# Patient Record
Sex: Female | Born: 1990 | Race: Black or African American | Hispanic: No | Marital: Married | Smoking: Never smoker
Health system: Southern US, Community
[De-identification: ages and names within clinical notes are randomized; demographics above are authoritative.]

## PROBLEM LIST (undated history)

## (undated) DIAGNOSIS — J302 Other seasonal allergic rhinitis: Secondary | ICD-10-CM

## (undated) DIAGNOSIS — N921 Excessive and frequent menstruation with irregular cycle: Secondary | ICD-10-CM

## (undated) DIAGNOSIS — R87619 Unspecified abnormal cytological findings in specimens from cervix uteri: Secondary | ICD-10-CM

## (undated) DIAGNOSIS — D259 Leiomyoma of uterus, unspecified: Secondary | ICD-10-CM

## (undated) DIAGNOSIS — N946 Dysmenorrhea, unspecified: Secondary | ICD-10-CM

## (undated) HISTORY — PX: LEEP: SHX91

## (undated) HISTORY — DX: Unspecified abnormal cytological findings in specimens from cervix uteri: R87.619

## (undated) HISTORY — DX: Excessive and frequent menstruation with irregular cycle: N92.1

## (undated) HISTORY — DX: Leiomyoma of uterus, unspecified: D25.9

## (undated) HISTORY — DX: Dysmenorrhea, unspecified: N94.6

---

## 2009-03-11 DIAGNOSIS — R87619 Unspecified abnormal cytological findings in specimens from cervix uteri: Secondary | ICD-10-CM

## 2009-03-11 HISTORY — DX: Unspecified abnormal cytological findings in specimens from cervix uteri: R87.619

## 2016-10-29 DIAGNOSIS — Z0000 Encounter for general adult medical examination without abnormal findings: Secondary | ICD-10-CM | POA: Diagnosis not present

## 2016-10-29 DIAGNOSIS — N920 Excessive and frequent menstruation with regular cycle: Secondary | ICD-10-CM | POA: Diagnosis not present

## 2016-10-29 DIAGNOSIS — Z713 Dietary counseling and surveillance: Secondary | ICD-10-CM | POA: Diagnosis not present

## 2016-11-05 MED FILL — NORG-ETHIN ESTRA 0.25-0.035: 0.25-35 | 84 days supply | Qty: 84 | Fill #0

## 2016-11-05 MED FILL — IBUPROFEN 800 MG TAB: 800 | 10 days supply | Qty: 30 | Fill #0

## 2016-12-23 ENCOUNTER — Ambulatory Visit (INDEPENDENT_AMBULATORY_CARE_PROVIDER_SITE_OTHER): Payer: 59 | Admitting: Physician Assistant

## 2016-12-23 ENCOUNTER — Encounter: Payer: Self-pay | Admitting: Physician Assistant

## 2016-12-23 VITALS — BP 119/81 | HR 86 | Ht 66.0 in | Wt 207.0 lb

## 2016-12-23 DIAGNOSIS — Z7689 Persons encountering health services in other specified circumstances: Secondary | ICD-10-CM | POA: Diagnosis not present

## 2016-12-23 DIAGNOSIS — Z6833 Body mass index (BMI) 33.0-33.9, adult: Secondary | ICD-10-CM | POA: Diagnosis not present

## 2016-12-23 DIAGNOSIS — R635 Abnormal weight gain: Secondary | ICD-10-CM | POA: Insufficient documentation

## 2016-12-23 DIAGNOSIS — R5383 Other fatigue: Secondary | ICD-10-CM | POA: Insufficient documentation

## 2016-12-23 DIAGNOSIS — E6609 Other obesity due to excess calories: Secondary | ICD-10-CM

## 2016-12-23 MED ORDER — PHENTERMINE HCL 37.5 MG PO TABS: ORAL_TABLET | ORAL | 0 refills | Status: DC

## 2016-12-23 MED FILL — PHENTERMINE 37.5 MG TABLET: 37.5 | 30 days supply | Qty: 30 | Fill #0

## 2016-12-23 NOTE — Progress Notes (Signed)
HPI:                                                                Penny Klein is a 26 y.o. female who presents to [facility] today to establish care  Current concerns: weight gain  Patient reports weight fluctuates between 208-215. Last healthy weight: 2016, 180 Lb Goal weight: 170-180 Lb Diet goal: high protein, low fat, measuring cups Exercise goal: walking in her neighborhood, 3-4 days per week Patient also reports lack of energy and worsening fatigue for the past 2 years. Reports adequate sleep, 7 hours, no nighttime awakenings.  Past Medical History:  Diagnosis Date  . Dysmenorrhea   . Leiomyoma of uterus   . Menometrorrhagia    No past surgical history on file. Social History  Substance Use Topics  . Smoking status: Not on file  . Smokeless tobacco: Not on file  . Alcohol use Not on file   family history is not on file.  ROS: Review of Systems  Constitutional: Positive for malaise/fatigue (fatigue x 2 years).  Respiratory: Negative.   Cardiovascular: Negative.   Gastrointestinal: Negative.   Genitourinary: Negative.        + dysmenorrhea  Musculoskeletal: Negative.   Skin: Negative.   Neurological: Negative.      Medications: Current Outpatient Prescriptions  Medication Sig Dispense Refill  . ibuprofen (ADVIL,MOTRIN) 800 MG tablet Take 800 mg by mouth.     No current facility-administered medications for this visit.    No Known Allergies     Objective:  BP 119/81   Pulse 86   Ht 5\' 6"  (1.676 m)   Wt 207 lb (93.9 kg)   LMP 12/06/2016   BMI 33.41 kg/m  Gen:  alert, not ill-appearing, no distress, appropriate for age 31: head normocephalic without obvious abnormality, conjunctiva and cornea clear, trachea midline Pulm: Normal work of breathing, normal phonation, clear to auscultation bilaterally, no wheezes, rales or rhonchi CV: Normal rate, regular rhythm, s1 and s2 distinct, no murmurs,  clicks or rubs  Neuro: alert and oriented x 3, no tremor MSK: extremities atraumatic, normal gait and station Skin: intact, no rashes on exposed skin, no jaundice, no cyanosis Psych: well-groomed, cooperative, good eye contact, euthymic mood, affect mood-congruent, speech is articulate, and thought processes clear and goal-directed  No flowsheet data found.   No results found for this or any previous visit (from the past 72 hour(s)). No results found.    Assessment and Plan: 26 y.o. female with   1. Encounter to establish care - reviewed PMH, PSH, PFH, medications and allergies  2. Lack of energy   3. Other fatigue - CBC - Comprehensive metabolic panel - C-reactive protein - Ferritin - Sedimentation rate - TSH - Vitamin B12 - Vit D  25 hydroxy (rtn osteoporosis monitoring)  4. Abnormal weight gain Wt Readings from Last 3 Encounters:  12/23/16 207 lb (93.9 kg)  Weight loss goal: 27 pounds Exercise goal: walk/jog x 30-45 minutes at least 3 days per week Dietary goal:  Limit calories to 1800 per day to lose 1 pound per week, Limit to 1300 per day to lose 2 pounds/week  Measure caloric intake with app such as MyFitness Pal Follow DASH or Mediterranean eating  plans (heart healthy, low fat, Klein protein) Cut out soda and sweetened beverages Drink at least 1L of water per day Substitute fruit for sweets/processed sugar - phentermine (ADIPEX-P) 37.5 MG tablet; One tab by mouth qAM  Dispense: 30 tablet; Refill: 0  5. Class 1 obesity due to excess calories without serious comorbidity with body mass index (BMI) of 33.0 to 33.9 in adult - weight loss through calorie restriction and regular cardiovascular exercise in addition to the above     Patient education and anticipatory guidance given Patient agrees with treatment plan Follow-up in 4 weeks for weight check or sooner as needed if symptoms worsen or fail to improve  Darlyne Russian PA-C

## 2016-12-23 NOTE — Patient Instructions (Addendum)
Weight loss goal: 27 pounds Exercise goal: walk/jog x 30-45 minutes at least 3 days per week Dietary goal:  Limit calories to 1800 per day to lose 1 pound per week, Limit to 1300 per day to lose 2 pounds/week  Measure caloric intake with app such as MyFitness Pal Follow DASH or Mediterranean eating plans (heart healthy, low fat, lean protein) Cut out soda and sweetened beverages Drink at least 1L of water per day Substitute fruit for sweets/processed sugar

## 2016-12-25 ENCOUNTER — Encounter: Payer: Self-pay | Admitting: Physician Assistant

## 2016-12-31 DIAGNOSIS — H9201 Otalgia, right ear: Secondary | ICD-10-CM | POA: Diagnosis not present

## 2016-12-31 DIAGNOSIS — H938X9 Other specified disorders of ear, unspecified ear: Secondary | ICD-10-CM | POA: Diagnosis not present

## 2017-01-01 ENCOUNTER — Encounter: Payer: Self-pay | Admitting: Physician Assistant

## 2017-01-02 ENCOUNTER — Emergency Department (HOSPITAL_COMMUNITY)
Admission: EM | Admit: 2017-01-02 | Discharge: 2017-01-02 | Disposition: A | Discharge status: Home / Self Care | Payer: 59 | Attending: Emergency Medicine | Admitting: Emergency Medicine

## 2017-01-02 ENCOUNTER — Other Ambulatory Visit: Payer: Self-pay

## 2017-01-02 ENCOUNTER — Emergency Department (HOSPITAL_COMMUNITY): Payer: 59

## 2017-01-02 DIAGNOSIS — H6983 Other specified disorders of Eustachian tube, bilateral: Secondary | ICD-10-CM | POA: Diagnosis not present

## 2017-01-02 DIAGNOSIS — J3089 Other allergic rhinitis: Secondary | ICD-10-CM | POA: Insufficient documentation

## 2017-01-02 DIAGNOSIS — Z79899 Other long term (current) drug therapy: Secondary | ICD-10-CM | POA: Insufficient documentation

## 2017-01-02 DIAGNOSIS — H6993 Unspecified Eustachian tube disorder, bilateral: Secondary | ICD-10-CM | POA: Diagnosis not present

## 2017-01-02 DIAGNOSIS — R072 Precordial pain: Secondary | ICD-10-CM | POA: Diagnosis not present

## 2017-01-02 DIAGNOSIS — R0602 Shortness of breath: Secondary | ICD-10-CM | POA: Insufficient documentation

## 2017-01-02 DIAGNOSIS — R079 Chest pain, unspecified: Secondary | ICD-10-CM | POA: Diagnosis not present

## 2017-01-02 LAB — BASIC METABOLIC PANEL
Anion gap: 11 (ref 5–15)
BUN: 9 mg/dL (ref 6–20)
CHLORIDE: 106 mmol/L (ref 101–111)
CO2: 23 mmol/L (ref 22–32)
CREATININE: 0.75 mg/dL (ref 0.44–1.00)
Calcium: 10 mg/dL (ref 8.9–10.3)
GFR calc non Af Amer: >60 mL/min (ref >60)
GLUCOSE: 85 mg/dL (ref 65–99)
Potassium: 3.7 mmol/L (ref 3.5–5.1)
Sodium: 140 mmol/L (ref 135–145)

## 2017-01-02 LAB — I-STAT TROPONIN, ED: Troponin i, poc: 0 ng/mL (ref 0.00–0.08)

## 2017-01-02 LAB — CBC
HCT: 42.5 % (ref 36.0–46.0)
Hemoglobin: 14.3 g/dL (ref 12.0–15.0)
MCH: 29.2 pg (ref 26.0–34.0)
MCHC: 33.6 g/dL (ref 30.0–36.0)
MCV: 86.9 fL (ref 78.0–100.0)
Platelets: 337 10*3/uL (ref 150–400)
RBC: 4.89 MIL/uL (ref 3.87–5.11)
RDW: 13.1 % (ref 11.5–15.5)
WBC: 6.3 10*3/uL (ref 4.0–10.5)

## 2017-01-02 LAB — D-DIMER, QUANTITATIVE: D-Dimer, Quant: 0.27 ug/mL-FEU (ref 0.00–0.50)

## 2017-01-02 MED ORDER — FLUTICASONE PROPIONATE 50 MCG/ACT NA SUSP: 2.0000 | Freq: Every day | NASAL | 0 refills | Status: DC

## 2017-01-02 MED ORDER — RANITIDINE HCL 150 MG PO CAPS: 150.0000 mg | ORAL_CAPSULE | Freq: Every day | ORAL | 0 refills | Status: DC

## 2017-01-02 MED ORDER — ASPIRIN 81 MG PO CHEW
324.0000 mg | CHEWABLE_TABLET | Freq: Once | ORAL | Status: AC
  Administered 2017-01-02: 324 mg via ORAL
  Filled 2017-01-02: qty 4

## 2017-01-02 NOTE — ED Notes (Signed)
ED Provider at bedside. 

## 2017-01-02 NOTE — ED Triage Notes (Signed)
To ED for eval of chest pressure starting last pm. Feels winded when walking across the room. Denies n/v/diaphoresis. No hx of same. No long trips noted.

## 2017-01-02 NOTE — ED Notes (Signed)
Pt ambulatory to the BR with steady gait.  Pt reports pain in L upper chest is better that it was.  Rates it at 3/10.  Denies any dizziness or SOB

## 2017-01-02 NOTE — ED Provider Notes (Signed)
[facility] EMERGENCY DEPARTMENT Provider Note   CSN: [id number] Arrival date & time: 01/02/17  0831     History   Chief Complaint Chief Complaint  Patient presents with  . Chest Pain    HPI Penny Klein is a 27 y.o. female.  Penny Klein is a 26 y.o. Female who presents to the ED complaining of left-sided chest pain starting yesterday evening.  She reports her pain is throbbing and across the left side of her chest.  She reports associated shortness of breath and last night she fell asleep while she was still having pain.  When she awoke this morning her pain had resolved.  Her pain returned today around 7:30 AM when she went back to work.  She reports again feeling short of breath.  No pain with deep inspiration.  She denies history of DVT, PE or MI.  She has previously been on birth control pills, however she discontinued this about a month ago.  Last menstrual cycle was 12/23/2016.  She denies recent long travel, leg pain or leg swelling.  She is unable to identify alleviating or aggravating symptoms of her chest pain today.  It is not worse with exertion today.  She denies history of smoking or hyperlipidemia.  No personal or close family history of MI, DVT or PE.  No recent long travel.  She denies fevers, coughing, palpitations, leg pain, leg swelling, syncope, lightheadedness, dizziness, neck pain, abdominal pain, nausea, vomiting, or acid reflux symptoms.   The history is provided by the patient and medical records. No language interpreter was used.  Chest Pain   Associated symptoms include shortness of breath. Pertinent negatives include no abdominal pain, no back pain, no cough, no fever, no headaches, no nausea, no palpitations, no vomiting and no weakness.    Past Medical History:  Diagnosis Date  . Abnormal Pap smear of cervix 2011  . Dysmenorrhea   . Leiomyoma of uterus   . Menometrorrhagia     Patient Active Problem List   Diagnosis Date  Noted  . Lack of energy 12/23/2016  . Other fatigue 12/23/2016  . Abnormal weight gain 12/23/2016  . Class 1 obesity due to excess calories without serious comorbidity with body mass index (BMI) of 33.0 to 33.9 in adult 12/23/2016    Past Surgical History:  Procedure Laterality Date  . LEEP      OB History    No data available       Home Medications    Prior to Admission medications   Medication Sig Start Date End Date Taking? Authorizing Provider  ibuprofen (ADVIL,MOTRIN) 800 MG tablet Take 800 mg by mouth every 8 (eight) hours as needed for moderate pain.  11/05/16  Yes [provider]  norgestimate-ethinyl estradiol (ORTHO-CYCLEN,SPRINTEC,PREVIFEM) 0.25-35 MG-MCG tablet Take 1 tablet by mouth daily. 11/05/16  Yes [provider]  phentermine (ADIPEX-P) 37.5 MG tablet One tab by mouth qAM 12/23/16  Yes Trixie Dredge, PA-C  fluticasone [facility]) 50 MCG/ACT nasal spray Place 2 sprays into both nostrils daily. 01/02/17   Waynetta Pean, PA-C  ranitidine (ZANTAC) 150 MG capsule Take 1 capsule (150 mg total) by mouth daily. 01/02/17   Waynetta Pean, PA-C    Family History Family History  Problem Relation Age of Onset  . Lupus Paternal Uncle   . Hypertension Mother   . Hyperlipidemia Mother   . Obesity Mother   . Hypertension Father   . Anxiety disorder Father   . Hyperlipidemia Father   .  Breast cancer Maternal Aunt   . Breast cancer Maternal Grandmother   . Stomach cancer Paternal Grandmother   . Breast cancer Maternal Aunt     Social History Social History  Substance Use Topics  . Smoking status: Never Smoker  . Smokeless tobacco: Never Used  . Alcohol use Yes     Comment: <1 per month     Allergies   Patient has no known allergies.   Review of Systems Review of Systems  Constitutional: Negative for chills and fever.  HENT: Negative for congestion and sore throat.   Eyes: Negative for visual disturbance.  Respiratory:  Positive for shortness of breath. Negative for cough and wheezing.   Cardiovascular: Positive for chest pain. Negative for palpitations and leg swelling.  Gastrointestinal: Negative for abdominal pain, diarrhea, nausea and vomiting.  Genitourinary: Negative for dysuria.  Musculoskeletal: Negative for back pain and neck pain.  Skin: Negative for rash.  Neurological: Negative for syncope, weakness, light-headedness and headaches.     Physical Exam Updated Vital Signs BP 118/74   Pulse 79   Temp 97.8 F (36.6 C) (Oral)   Resp 15   LMP 12/23/2016 (Exact Date)   SpO2 98%   Physical Exam  Constitutional: She appears well-developed and well-nourished. No distress.  Nontoxic appearing.  HENT:  Head: Normocephalic and atraumatic.  Mouth/Throat: Oropharynx is clear and moist.  Mild bilateral clear middle ear effusion without TM erythema or loss of landmarks.  Boggy nasal turbinates noted bilaterally.  Throat is clear.  Eyes: Pupils are equal, round, and reactive to light. Conjunctivae are normal. Right eye exhibits no discharge. Left eye exhibits no discharge.  Neck: Normal range of motion. Neck supple. No JVD present. No tracheal deviation present.  Cardiovascular: Normal rate, regular rhythm, normal heart sounds and intact distal pulses.  Exam reveals no gallop and no friction rub.   No murmur heard. Bilateral radial, posterior tibialis and dorsalis pedis pulses are intact.    Pulmonary/Chest: Effort normal and breath sounds normal. No stridor. No respiratory distress. She has no wheezes. She has no rales. She exhibits tenderness.  Lungs are clear to ascultation bilaterally. Symmetric chest expansion bilaterally. No increased work of breathing. No rales or rhonchi.  Mild left sided anterior chest wall TTP. No overlying skin changes.   Abdominal: Soft. There is no tenderness. There is no guarding.  Musculoskeletal: She exhibits no edema or tenderness.  No LE edema or TTP.     Lymphadenopathy:    She has no cervical adenopathy.  Neurological: She is alert. No sensory deficit. Coordination normal.  Skin: Skin is warm and dry. Capillary refill takes less than 2 seconds. No rash noted. She is not diaphoretic. No erythema. No pallor.  Psychiatric: She has a normal mood and affect. Her behavior is normal.  Nursing note and vitals reviewed.    ED Treatments / Results  Labs (all labs ordered are listed, but only abnormal results are displayed) Labs Reviewed  BASIC METABOLIC PANEL  CBC  D-DIMER, QUANTITATIVE (NOT AT [facility])  I-STAT TROPONIN, ED    EKG  EKG Interpretation  Date/Time:  Thursday January 02 2017 08:35:42 EDT Ventricular Rate:  95 PR Interval:  132 QRS Duration: 80 QT Interval:  358 QTC Calculation: 449 R Axis:   83 Text Interpretation:  Normal sinus rhythm with sinus arrhythmia Normal ECG No old tracing to compare Confirmed by Sherwood Gambler [phone]) on 01/02/2017 9:18:23 AM       Radiology Dg Chest 2 View  Result  Date: 01/02/2017 CLINICAL DATA:  Chest pain. EXAM: CHEST  2 VIEW COMPARISON:  None. FINDINGS: The heart size and mediastinal contours are within normal limits. Both lungs are clear. The visualized skeletal structures are unremarkable. IMPRESSION: No active cardiopulmonary disease. Electronically Signed   By: Dorise Bullion III M.D   On: 01/02/2017 09:43    Procedures Procedures (including critical care time)  Medications Ordered in ED Medications  aspirin chewable tablet 324 mg (324 mg Oral Given 01/02/17 1154)     Initial Impression / Assessment and Plan / ED Course  I have reviewed the triage vital signs and the nursing notes.  Pertinent labs & imaging results that were available during my care of the patient were reviewed by me and considered in my medical decision making (see chart for details).    This is a 26 y.o. Female who presents to the ED complaining of left-sided chest pain starting yesterday evening.  She  reports her pain is throbbing and across the left side of her chest.  She reports associated shortness of breath and last night she fell asleep while she was still having pain.  When she awoke this morning her pain had resolved.  Her pain returned today around 7:30 AM when she went back to work.  She reports again feeling short of breath.  No pain with deep inspiration.  She denies history of DVT, PE or MI.  She has previously been on birth control pills, however she discontinued this about a month ago.  Last menstrual cycle was 12/23/2016.  She denies recent long travel, leg pain or leg swelling.  She is unable to identify alleviating or aggravating symptoms of her chest pain today.  It is not worse with exertion today.  She denies history of smoking or hyperlipidemia.  No personal or close family history of MI, DVT or PE.  No recent long travel.  She denies fevers, coughing, palpitations.  On exam the patient is afebrile nontoxic appearing.  She has no hypoxia, tachypnea or tachycardia on exam.  No lower extremity edema or tenderness.  She does complain of some ear pressure and popping and she has a mild clear middle ear effusion bilaterally.  Eustachian tube dysfunction noted.  Will provide a prescription for Flonase. I have low suspicion for PE in this patient, however patient did describe trouble breathing with her chest pain.  D-dimer was obtained and this was negative.  Troponin is not elevated.  BMP and CBC are within normal limits.  Chest x-ray is unremarkable. EKG shows normal sinus rhythm.  No STEMI. No concern for ACS in this patient.  At reevaluation patient reports she is feeling much better.  She reports that she does not feel short of breath.  She reports only mild pain to the left side of her chest that she reports may be due to reflux.  She would like to try a course of ranitidine for reflux.  I also discussed the possibility of discontinuing the phentermine as this could make her anxious and  cause chest pain as well. She will discuss with her PCP. Return precautions discussed. I advised the patient to follow-up with their primary care provider this week. I advised the patient to return to the emergency department with new or worsening symptoms or new concerns. The patient verbalized understanding and agreement with plan.    Final Clinical Impressions(s) / ED Diagnoses   Final diagnoses:  Precordial pain  Dysfunction of both eustachian tubes  Allergic rhinitis due to  other allergic trigger, unspecified seasonality    New Prescriptions New Prescriptions   FLUTICASONE (FLONASE) 50 MCG/ACT NASAL SPRAY    Place 2 sprays into both nostrils daily.   RANITIDINE (ZANTAC) 150 MG CAPSULE    Take 1 capsule (150 mg total) by mouth daily.     Waynetta Pean, PA-C 01/02/17 1218    Sherwood Gambler, MD 01/02/17 1540

## 2017-01-02 NOTE — ED Notes (Signed)
Family at bedside. 

## 2017-01-02 NOTE — ED Notes (Signed)
Pt taken to xray. Radiology informed of pts room.

## 2017-01-02 NOTE — ED Notes (Signed)
Pt up too restroom no issues pt states she feels fine.

## 2017-01-20 ENCOUNTER — Ambulatory Visit: Payer: 59 | Admitting: Physician Assistant

## 2017-01-20 DIAGNOSIS — Z0189 Encounter for other specified special examinations: Secondary | ICD-10-CM

## 2017-03-11 NOTE — L&D Delivery Note (Signed)
Delivery Note At 6:16 PM a viable female was delivered via Vaginal, Spontaneous (Presentation:Direct OP ).  APGAR:9 ,9 ; weight pending .   Placenta status:ijntact, spontaneous delivery.  Cord: three vessels with the following complications: none.  Clamped x 2 and cut by FOB after 2 min. Cord pH: N/A  Anesthesia:  Epidural plus lidocaine for repair Episiotomy: None Lacerations: 1st degree Suture Repair: 3.0 vicryl Est. Blood Loss (mL):    Mom to postpartum.  Baby to Couplet care / Skin to Skin.  Darlina Rumpf, CNM 10/30/2017, 6:42 PM

## 2017-03-14 ENCOUNTER — Ambulatory Visit (INDEPENDENT_AMBULATORY_CARE_PROVIDER_SITE_OTHER): Payer: 59 | Admitting: Family Medicine

## 2017-03-14 ENCOUNTER — Encounter: Payer: Self-pay | Admitting: Family Medicine

## 2017-03-14 VITALS — BP 122/75 | HR 76 | Wt 201.0 lb

## 2017-03-14 DIAGNOSIS — Z3480 Encounter for supervision of other normal pregnancy, unspecified trimester: Secondary | ICD-10-CM | POA: Diagnosis not present

## 2017-03-14 DIAGNOSIS — O99213 Obesity complicating pregnancy, third trimester: Secondary | ICD-10-CM

## 2017-03-14 DIAGNOSIS — E6609 Other obesity due to excess calories: Secondary | ICD-10-CM | POA: Diagnosis not present

## 2017-03-14 DIAGNOSIS — Z3687 Encounter for antenatal screening for uncertain dates: Secondary | ICD-10-CM

## 2017-03-14 DIAGNOSIS — Z6833 Body mass index (BMI) 33.0-33.9, adult: Secondary | ICD-10-CM

## 2017-03-14 DIAGNOSIS — Z113 Encounter for screening for infections with a predominantly sexual mode of transmission: Secondary | ICD-10-CM | POA: Diagnosis not present

## 2017-03-14 NOTE — Progress Notes (Signed)
Subjective:  Penny Klein is a [id number] [redacted]w[redacted]d being seen today for her first obstetrical visit.  Her obstetrical history is significant for obesity. Patient does intend to breast feed. Pregnancy history fully reviewed.  Patient reports no complaints.  PAP last year - normal. Has history of LEEP after first child. Subsequent PAPs normal.  BP 122/75   Pulse 76   Wt 201 lb (91.2 kg)   LMP 01/23/2017   BMI 32.44 kg/m   HISTORY: OB History  Gravida Para Term Preterm AB Living  3 2 2     2   SAB TAB Ectopic Multiple Live Births          2    # Outcome Date GA Lbr Len/2nd Weight Sex Delivery Anes PTL Lv  3 Current           2 Term 07/30/12 [redacted]w[redacted]d   F Vag-Spont   LIV  1 Term 03/14/09 [redacted]w[redacted]d   M Vag-Spont   LIV      Past Medical History:  Diagnosis Date  . Abnormal Pap smear of cervix 2011  . Dysmenorrhea   . Leiomyoma of uterus   . Menometrorrhagia     Past Surgical History:  Procedure Laterality Date  . LEEP      Family History  Problem Relation Age of Onset  . Lupus Paternal Uncle   . Hypertension Mother   . Hyperlipidemia Mother   . Obesity Mother   . Hypertension Father   . Anxiety disorder Father   . Hyperlipidemia Father   . Breast cancer Maternal Aunt   . Breast cancer Maternal Grandmother   . Stomach cancer Paternal Grandmother   . Breast cancer Maternal Aunt      Exam    Uterus:     Pelvic Exam:    Perineum: No Hemorrhoids, Normal Perineum   Vulva: normal, Bartholin's, Urethra, Skene's normal   Vagina:  normal mucosa   Cervix: multiparous appearance, no cervical motion tenderness and no lesions   Adnexa: normal adnexa and no mass, fullness, tenderness   Bony Pelvis: gynecoid  System: Breast:  normal appearance, no masses or tenderness, Inspection negative, No nipple retraction or dimpling, No nipple discharge or bleeding, No axillary or supraclavicular adenopathy, Normal to palpation without dominant masses   Skin: normal coloration and turgor, no  rashes    Neurologic: gait normal; reflexes normal and symmetric   Extremities: normal strength, tone, and muscle mass, no erythema, induration, or nodules, ROM of all joints is normal   HEENT PERRLA, extra ocular movement intact and sclera clear, anicteric   Mouth/Teeth mucous membranes moist, pharynx normal without lesions   Neck supple and no masses   Cardiovascular: regular rate and rhythm, no murmurs or gallops   Respiratory:  appears well, vitals normal, no respiratory distress, acyanotic, normal RR, ear and throat exam is normal, neck free of mass or lymphadenopathy, chest clear, no wheezing, crepitations, rhonchi, normal symmetric air entry   Abdomen: soft, non-tender; bowel sounds normal; no masses,  no organomegaly   Urinary: urethral meatus normal      Assessment:    Pregnancy: [id number] Patient Active Problem List   Diagnosis Date Noted  . Supervision of other normal pregnancy, antepartum 03/14/2017  . Lack of energy 12/23/2016  . Other fatigue 12/23/2016  . Abnormal weight gain 12/23/2016  . Class 1 obesity due to excess calories without serious comorbidity with body mass index (BMI) of 33.0 to 33.9 in adult 12/23/2016      Plan:  1. Supervision of other normal pregnancy, antepartum Genetic Screening discussed - patient declined testing.  Ultrasound discussed; fetal survey: requested.  Follow up in 4 weeks.  - Obstetric Panel, Including HIV - Culture, OB Urine - VITAMIN D 25 Hydroxy (Vit-D Deficiency, Fractures) - HgB A1c - Urine cytology ancillary only  2. Class 1 obesity due to excess calories without serious comorbidity with body mass index (BMI) of 33.0 to 33.9 in adult - HgB A1c     Problem list reviewed and updated. 50% of 30 min visit spent on counseling and coordination of care.    Truett Mainland 03/14/2017

## 2017-03-14 NOTE — Progress Notes (Signed)
DATING AND VIABILITY SONOGRAM   Penny Klein is a 27 y.o. year old [id number] with LMP Patient's last menstrual period was 01/23/2017. which would correlate to  [redacted]w[redacted]d weeks gestation.  She has regular menstrual cycles.   She is here today for a confirmatory initial sonogram.    GESTATION: singleton   FETAL ACTIVITY:          Heart rate         122           ADNEXA: The ovaries are normal.   GESTATIONAL AGE AND  BIOMETRICS:  Gestational criteria: Estimated Date of Delivery: 10/30/17 by LMP now at [redacted]w[redacted]d  Previous Scans:0      CROWN RUMP LENGTH       0.964mm         7 weeks                                                                               AVERAGE EGA(BY THIS SCAN):  7 weeks  WORKING EDD( LMP ):  10-30-17     TECHNICIAN COMMENTS   A copy of this report including all images has been saved and backed up to a second source for retrieval if needed. All measures and details of the anatomical scan, placentation, fluid volume and pelvic anatomy are contained in that report.  [name] 03/14/2017 9:01 AM

## 2017-03-14 NOTE — Progress Notes (Signed)
Last pap 2017, negative. Pt has had Leep procedure after her first child was born.  Pt states she is having slight HA's and constipation.

## 2017-03-15 LAB — OBSTETRIC PANEL, INCLUDING HIV
Antibody Screen: NEGATIVE
BASOS ABS: 0 10*3/uL (ref 0.0–0.2)
Basos: 0 %
EOS (ABSOLUTE): 0.1 10*3/uL (ref 0.0–0.4)
Eos: 1 %
HEP B S AG: NEGATIVE
HIV Screen 4th Generation wRfx: NONREACTIVE
Hematocrit: 40.8 % (ref 34.0–46.6)
Hemoglobin: 13.5 g/dL (ref 11.1–15.9)
IMMATURE GRANULOCYTES: 0 %
Immature Grans (Abs): 0 10*3/uL (ref 0.0–0.1)
LYMPHS ABS: 2 10*3/uL (ref 0.7–3.1)
Lymphs: 21 %
MCH: 29.5 pg (ref 26.6–33.0)
MCHC: 33.1 g/dL (ref 31.5–35.7)
MCV: 89 fL (ref 79–97)
Monocytes Absolute: 0.4 10*3/uL (ref 0.1–0.9)
Monocytes: 5 %
NEUTROS ABS: 6.9 10*3/uL (ref 1.4–7.0)
NEUTROS PCT: 73 %
PLATELETS: 304 10*3/uL (ref 150–379)
RBC: 4.57 x10E6/uL (ref 3.77–5.28)
RDW: 14.5 % (ref 12.3–15.4)
RH TYPE: POSITIVE
RPR: NONREACTIVE
Rubella Antibodies, IGG: 12.9 index (ref >0.99)
WBC: 9.4 10*3/uL (ref 3.4–10.8)

## 2017-03-15 LAB — HEMOGLOBIN A1C
Est. average glucose Bld gHb Est-mCnc: 117 mg/dL
HEMOGLOBIN A1C: 5.7 % — AB (ref 4.8–5.6)

## 2017-03-15 LAB — VITAMIN D 25 HYDROXY (VIT D DEFICIENCY, FRACTURES): Vit D, 25-Hydroxy: 9.5 ng/mL — ABNORMAL LOW (ref 30.0–100.0)

## 2017-03-17 ENCOUNTER — Telehealth: Payer: Self-pay

## 2017-03-17 ENCOUNTER — Other Ambulatory Visit: Payer: Self-pay | Admitting: Family Medicine

## 2017-03-17 DIAGNOSIS — E559 Vitamin D deficiency, unspecified: Secondary | ICD-10-CM

## 2017-03-17 LAB — URINE CYTOLOGY ANCILLARY ONLY
CHLAMYDIA, DNA PROBE: NEGATIVE
Neisseria Gonorrhea: NEGATIVE

## 2017-03-17 LAB — CULTURE, OB URINE

## 2017-03-17 LAB — URINE CULTURE, OB REFLEX

## 2017-03-17 MED ORDER — VITAMIN D (ERGOCALCIFEROL) 1.25 MG (50000 UNIT) PO CAPS: 50000.0000 [IU] | ORAL_CAPSULE | ORAL | 1 refills | Status: DC

## 2017-03-17 NOTE — Telephone Encounter (Signed)
Pt made aware of results and need for early 2hr GTT.   Attempt to call pt back regarding appt.  No answer, no VM

## 2017-03-17 NOTE — Telephone Encounter (Signed)
Attempted to reach patient and voicemailbox not set up yet. Penny Klein RNBSN

## 2017-03-17 NOTE — Telephone Encounter (Signed)
-----   Message from Truett Mainland, DO sent at 03/17/2017  8:21 AM EST ----- HgA1c elevated. Should have 2hr GTT. Also, Vit D deficient - Supplement prescribed: 50,000 IU weekly.

## 2017-03-20 ENCOUNTER — Telehealth: Payer: Self-pay

## 2017-03-20 NOTE — Telephone Encounter (Signed)
Patient called and states she had a small episode of spotting this morning. Patient states that once she went to the restroom there was no bleeding.   Patient is 8 weeks. Patient made aware that one isolated episode of bleeding is ok. Patient made aware that if bleeding is heavy like a period or has cramping she should go to [facility] for evaluation. Patient states understanding. [name] RNBSN

## 2017-03-31 ENCOUNTER — Encounter: Payer: 59 | Admitting: Certified Nurse Midwife

## 2017-04-03 MED FILL — VIT D2 1.25 MG (50,000 UNIT: 1.25 MG | 84 days supply | Qty: 12 | Fill #0

## 2017-04-11 ENCOUNTER — Ambulatory Visit (INDEPENDENT_AMBULATORY_CARE_PROVIDER_SITE_OTHER): Payer: 59 | Admitting: Family Medicine

## 2017-04-11 VITALS — BP 128/76 | HR 77 | Wt 201.0 lb

## 2017-04-11 DIAGNOSIS — Z6833 Body mass index (BMI) 33.0-33.9, adult: Secondary | ICD-10-CM

## 2017-04-11 DIAGNOSIS — E6609 Other obesity due to excess calories: Secondary | ICD-10-CM

## 2017-04-11 DIAGNOSIS — E559 Vitamin D deficiency, unspecified: Secondary | ICD-10-CM

## 2017-04-11 DIAGNOSIS — Z3480 Encounter for supervision of other normal pregnancy, unspecified trimester: Secondary | ICD-10-CM | POA: Diagnosis not present

## 2017-04-11 MED ORDER — RANITIDINE HCL 150 MG PO CAPS: 150.0000 mg | ORAL_CAPSULE | Freq: Every day | ORAL | 6 refills | Status: DC

## 2017-04-11 MED FILL — raNITIdine HCL 150 MG TABS: 150 | 60 days supply | Qty: 60 | Fill #0

## 2017-04-11 NOTE — Progress Notes (Signed)
   PRENATAL VISIT NOTE  Subjective:  Penny Klein is a 27 y.o. G3P2002 at [redacted]w[redacted]d being seen today for ongoing prenatal care.  She is currently monitored for the following issues for this low-risk pregnancy and has Lack of energy; Other fatigue; Abnormal weight gain; Class 1 obesity due to excess calories without serious comorbidity with body mass index (BMI) of 33.0 to 33.9 in adult; Supervision of other normal pregnancy, antepartum; and Vitamin D deficiency on their problem list.  Patient reports no complaints.  Contractions: Not present. Vag. Bleeding: None.   . Denies leaking of fluid.   The following portions of the patient's history were reviewed and updated as appropriate: allergies, current medications, past family history, past medical history, past social history, past surgical history and problem list. Problem list updated.  Objective:   Vitals:   04/11/17 0817  BP: 128/76  Pulse: 77  Weight: 201 lb (91.2 kg)    Fetal Status: Fetal Heart Rate (bpm): 160         General:  Alert, oriented and cooperative. Patient is in no acute distress.  Skin: Skin is warm and dry. No rash noted.   Cardiovascular: Normal heart rate noted  Respiratory: Normal respiratory effort, no problems with respiration noted  Abdomen: Soft, gravid, appropriate for gestational age.  Pain/Pressure: Absent     Pelvic: Cervical exam deferred        Extremities: Normal range of motion.     Mental Status:  Normal mood and affect. Normal behavior. Normal judgment and thought content.   Assessment and Plan:  Pregnancy: G3P2002 at [redacted]w[redacted]d  1. Supervision of other normal pregnancy, antepartum FHT and FH normal - Glucose Tolerance, 2 Hours w/1 Hour  2. Class 1 obesity due to excess calories without serious comorbidity with body mass index (BMI) of 33.0 to 33.9 in adult 2 hr GTT today. HgA1c slightly elevated: prediabetic  3. Vit D def Taking supplementation  Preterm labor symptoms and general obstetric  precautions including but not limited to vaginal bleeding, contractions, leaking of fluid and fetal movement were reviewed in detail with the patient. Please refer to After Visit Summary for other counseling recommendations.  Return in about 4 weeks (around 05/09/2017) for OB f/u.   Truett Mainland, DO

## 2017-04-12 LAB — GLUCOSE TOLERANCE, 2 HOURS W/ 1HR
Glucose, 1 hour: 108 mg/dL (ref 65–179)
Glucose, 2 hour: 63 mg/dL — ABNORMAL LOW (ref 65–152)
Glucose, Fasting: 71 mg/dL (ref 65–91)

## 2017-04-17 ENCOUNTER — Encounter: Payer: Self-pay | Admitting: Family Medicine

## 2017-05-04 ENCOUNTER — Telehealth: Payer: 59 | Admitting: Physician Assistant

## 2017-05-04 DIAGNOSIS — Z3490 Encounter for supervision of normal pregnancy, unspecified, unspecified trimester: Secondary | ICD-10-CM

## 2017-05-04 DIAGNOSIS — R0602 Shortness of breath: Secondary | ICD-10-CM

## 2017-05-04 NOTE — Progress Notes (Signed)
Based on what you shared with me it looks like you have a serious condition that should be evaluated in a face to face office visit. Because you are pregnant and noting chest pain or shortness of breath you need assessment ASAP or to discuss with your OB or the on call doctor for your OBs office. Please do not delay care.  NOTE: If you entered your credit card information for this eVisit, you will not be charged. You may see a "hold" on your card for the $30 but that hold will drop off and you will not have a charge processed.  If you are having a true medical emergency please call 911.  If you need an urgent face to face visit, [place] has four urgent care centers for your convenience.  If you need care fast and have a high deductible or no insurance consider:   [email or web address] to reserve your spot online an avoid wait times  [facility] [address] [place], [place] [address] 8 am to 8 pm Monday-Friday 10 am to 4 pm Saturday-Sunday *Across the street from [employer]  [place], [address] 8 am to 5 pm Monday-Friday * In the [facility] on the [facility]   The following sites will take your  insurance:  . [facility] [place] a Provider at this Location  [address] [place], [place] [address] . 10 am to 8 pm Monday-Friday . 12 pm to 8 pm Saturday-Sunday   . [facility] [place] a Provider at this Location  [address] [place], [place], [place] [address] . 8 am to 8 pm Monday-Friday . 9 am to 6 pm Saturday . 11 am to 6 pm Sunday   . [facility] at [place] Get Driving Directions  [address] [place],  [address] . 8 am to 8 pm Monday-Friday . 8 am to 4 pm Saturday-Sunday   Your e-visit  answers were reviewed by a board certified advanced clinical practitioner to complete your personal care plan.  Thank you for using e-Visits.

## 2017-05-05 ENCOUNTER — Encounter (HOSPITAL_BASED_OUTPATIENT_CLINIC_OR_DEPARTMENT_OTHER): Payer: Self-pay | Admitting: Emergency Medicine

## 2017-05-05 ENCOUNTER — Other Ambulatory Visit: Payer: Self-pay

## 2017-05-05 ENCOUNTER — Emergency Department (HOSPITAL_BASED_OUTPATIENT_CLINIC_OR_DEPARTMENT_OTHER)
Admission: EM | Admit: 2017-05-05 | Discharge: 2017-05-05 | Disposition: A | Discharge status: Home / Self Care | Payer: 59 | Attending: Physician Assistant | Admitting: Physician Assistant

## 2017-05-05 DIAGNOSIS — J111 Influenza due to unidentified influenza virus with other respiratory manifestations: Secondary | ICD-10-CM | POA: Insufficient documentation

## 2017-05-05 DIAGNOSIS — R05 Cough: Secondary | ICD-10-CM | POA: Diagnosis not present

## 2017-05-05 DIAGNOSIS — O99519 Diseases of the respiratory system complicating pregnancy, unspecified trimester: Secondary | ICD-10-CM | POA: Diagnosis not present

## 2017-05-05 DIAGNOSIS — O9989 Other specified diseases and conditions complicating pregnancy, childbirth and the puerperium: Secondary | ICD-10-CM | POA: Diagnosis not present

## 2017-05-05 DIAGNOSIS — R69 Illness, unspecified: Secondary | ICD-10-CM

## 2017-05-05 DIAGNOSIS — Z79899 Other long term (current) drug therapy: Secondary | ICD-10-CM | POA: Insufficient documentation

## 2017-05-05 DIAGNOSIS — Z3A16 16 weeks gestation of pregnancy: Secondary | ICD-10-CM | POA: Insufficient documentation

## 2017-05-05 NOTE — ED Triage Notes (Addendum)
Patient reports headache since Friday.  Reports cough and congestion as well vomiting yesterday.  Currently pregnant.

## 2017-05-05 NOTE — ED Provider Notes (Signed)
[facility] EMERGENCY DEPARTMENT Provider Note   CSN: [id number] Arrival date & time: 05/05/17  0930     History   Chief Complaint Chief Complaint  Patient presents with  . Cough    HPI Penny Klein is a 27 y.o. female.  Patient is a 27 year old pregnant female presenting with nonproductive cough, headache.  PMH significant for a AUB and fibroids, obesity.  Onset 4 days ago with symptoms of nonproductive cough, headache.  Son was recently diagnosed and treated for the flu 1 week ago.  She is currently approximately 4 months pregnant.  Patient received annual flu shot.  She denies history of fevers or chills, nausea or vomiting, diarrhea, abdominal pain, vaginal bleeding or leaking.  Patient tried 1 dose of Benadryl without improvement of symptoms yesterday.      Past Medical History:  Diagnosis Date  . Abnormal Pap smear of cervix 2011  . Dysmenorrhea   . Leiomyoma of uterus   . Menometrorrhagia     Patient Active Problem List   Diagnosis Date Noted  . Vitamin D deficiency 03/17/2017  . Supervision of other normal pregnancy, antepartum 03/14/2017  . Lack of energy 12/23/2016  . Other fatigue 12/23/2016  . Abnormal weight gain 12/23/2016  . Class 1 obesity due to excess calories without serious comorbidity with body mass index (BMI) of 33.0 to 33.9 in adult 12/23/2016    Past Surgical History:  Procedure Laterality Date  . LEEP      OB History    Gravida Para Term Preterm AB Living   3 2 2     2    SAB TAB Ectopic Multiple Live Births           2       Home Medications    Prior to Admission medications   Medication Sig Start Date End Date Taking? Authorizing Provider  Prenatal Vit-Fe Fumarate-FA (MULTIVITAMIN-PRENATAL) 27-0.8 MG TABS tablet Take 1 tablet by mouth daily at 12 noon.    [provider]  ranitidine (ZANTAC) 150 MG capsule Take 1 capsule (150 mg total) by mouth daily. 04/11/17   Truett Mainland, DO  Vitamin D,  Ergocalciferol, (DRISDOL) 50000 units CAPS capsule Take 1 capsule (50,000 Units total) by mouth every 7 (seven) days. 03/17/17   Truett Mainland, DO    Family History Family History  Problem Relation Age of Onset  . Lupus Paternal Uncle   . Hypertension Mother   . Hyperlipidemia Mother   . Obesity Mother   . Hypertension Father   . Anxiety disorder Father   . Hyperlipidemia Father   . Breast cancer Maternal Aunt   . Breast cancer Maternal Grandmother   . Stomach cancer Paternal Grandmother   . Breast cancer Maternal Aunt     Social History Social History   Tobacco Use  . Smoking status: Never Smoker  . Smokeless tobacco: Never Used  Substance Use Topics  . Alcohol use: No    Frequency: Never    Comment: <1 per month  . Drug use: No     Allergies   Patient has no known allergies.   Review of Systems Review of Systems  Constitutional: Negative for chills and fever.  HENT: Negative for ear pain and sore throat.   Eyes: Negative for pain and visual disturbance.  Respiratory: Positive for cough. Negative for shortness of breath and wheezing.   Cardiovascular: Negative for chest pain and palpitations.  Gastrointestinal: Negative for abdominal pain, diarrhea, nausea and vomiting.  Genitourinary: Negative for dysuria, hematuria, pelvic pain, vaginal bleeding and vaginal discharge.  Musculoskeletal: Negative for arthralgias and back pain.  Skin: Negative for color change and rash.  Neurological: Positive for headaches. Negative for syncope.  All other systems reviewed and are negative.    Physical Exam Updated Vital Signs BP 125/69   Pulse 98   Temp 98.1 F (36.7 C)   Resp 18   Ht 5\' 6"  (1.676 m)   Wt 91.2 kg (201 lb)   LMP 01/23/2017   SpO2 100%   BMI 32.44 kg/m   Physical Exam  Constitutional: She appears well-developed and well-nourished. No distress.  HENT:  Head: Normocephalic and atraumatic.  Eyes: Conjunctivae are normal.  Neck: Neck supple.    Cardiovascular: Normal rate and regular rhythm.  No murmur heard. Pulmonary/Chest: Effort normal and breath sounds normal. No respiratory distress.  Nonproductive cough present  Abdominal: Soft. There is no tenderness.  Distended secondary to pregnancy  Musculoskeletal: She exhibits no edema.  Lymphadenopathy:    She has no cervical adenopathy.  Neurological: She is alert.  Skin: Skin is warm and dry. She is not diaphoretic.  Psychiatric: She has a normal mood and affect.  Nursing note and vitals reviewed.    ED Treatments / Results  Labs (all labs ordered are listed, but only abnormal results are displayed) Labs Reviewed - No data to display  EKG  EKG Interpretation None       Radiology No results found.  Procedures Procedures (including critical care time)  Medications Ordered in ED Medications - No data to display   Initial Impression / Assessment and Plan / ED Course  I have reviewed the triage vital signs and the nursing notes.  Pertinent labs & imaging results that were available during my care of the patient were reviewed by me and considered in my medical decision making (see chart for details).  Patient is a 27 year old pregnant female presenting with nonproductive cough and headache with recent flu exposure.  Vital stable on arrival.  Patient well-appearing on exam.  No history of volume loss including vomiting or diarrhea.  Clear to auscultation bilaterally.  She is currently 4 months pregnant without abdominal tenderness.  Patient denies history of vaginal bleeding or leaking.  She is outside of the window for Tamiflu.  Patient given information on medications appropriate for pregnancy.  Instructed to follow-up with her OB.  Reviewed return precautions.  Final Clinical Impressions(s) / ED Diagnoses   Final diagnoses:  Influenza-like illness    ED Discharge Orders    None       [place] Bing, DO 05/05/17 1024    Macarthur Critchley,  MD 05/05/17 1555

## 2017-05-12 ENCOUNTER — Ambulatory Visit (INDEPENDENT_AMBULATORY_CARE_PROVIDER_SITE_OTHER): Payer: 59 | Admitting: Family Medicine

## 2017-05-12 VITALS — BP 128/75 | HR 87 | Wt 202.0 lb

## 2017-05-12 DIAGNOSIS — J0110 Acute frontal sinusitis, unspecified: Secondary | ICD-10-CM

## 2017-05-12 DIAGNOSIS — Z3492 Encounter for supervision of normal pregnancy, unspecified, second trimester: Secondary | ICD-10-CM

## 2017-05-12 DIAGNOSIS — Z3490 Encounter for supervision of normal pregnancy, unspecified, unspecified trimester: Secondary | ICD-10-CM

## 2017-05-12 DIAGNOSIS — Z3480 Encounter for supervision of other normal pregnancy, unspecified trimester: Secondary | ICD-10-CM

## 2017-05-12 MED ORDER — AZITHROMYCIN 250 MG PO TABS: ORAL_TABLET | ORAL | 1 refills | Status: DC

## 2017-05-12 MED FILL — AZITHROMYCIN 250 MG TABLET: 250 | 5 days supply | Qty: 6 | Fill #0

## 2017-05-12 NOTE — Progress Notes (Signed)
   PRENATAL VISIT NOTE  Subjective:  Penny Klein is a 27 y.o. G3P2002 at [redacted]w[redacted]d being seen today for ongoing prenatal care.  She is currently monitored for the following issues for this low-risk pregnancy and has Lack of energy; Other fatigue; Abnormal weight gain; Class 1 obesity due to excess calories without serious comorbidity with body mass index (BMI) of 33.0 to 33.9 in adult; Supervision of other normal pregnancy, antepartum; and Vitamin D deficiency on their problem list.  Patient reports sinus pain/HA for 3 weeks. Mucopurulent rhinorrhea..  Contractions: Not present. Vag. Bleeding: None.   . Denies leaking of fluid.   The following portions of the patient's history were reviewed and updated as appropriate: allergies, current medications, past family history, past medical history, past social history, past surgical history and problem list. Problem list updated.  Objective:   Vitals:   05/12/17 0826  BP: 128/75  Pulse: 87  Weight: 202 lb (91.6 kg)    Fetal Status: Fetal Heart Rate (bpm): 155         General:  Alert, oriented and cooperative. Patient is in no acute distress.  Skin: Skin is warm and dry. No rash noted.   Cardiovascular: Normal heart rate noted  Respiratory: Normal respiratory effort, no problems with respiration noted  Abdomen: Soft, gravid, appropriate for gestational age.  Pain/Pressure: Absent     Pelvic: Cervical exam deferred        Extremities: Normal range of motion.  Edema: None  Mental Status:  Normal mood and affect. Normal behavior. Normal judgment and thought content.   Assessment and Plan:  Pregnancy: G3P2002 at [redacted]w[redacted]d  1. Supervision of other normal pregnancy, antepartum FHT and FH normal  2. Prenatal care, antepartum - [name] MFM OB DETAIL +14 WK; Future  3. Acute non-recurrent frontal sinusitis Azithromycin. Mucinex recommended  Preterm labor symptoms and general obstetric precautions including but not limited to vaginal bleeding,  contractions, leaking of fluid and fetal movement were reviewed in detail with the patient. Please refer to After Visit Summary for other counseling recommendations.  Return in about 4 weeks (around 06/09/2017) for OB f/u.   Truett Mainland, DO

## 2017-05-12 NOTE — Patient Instructions (Signed)

## 2017-05-15 ENCOUNTER — Ambulatory Visit: Payer: 59 | Admitting: Family Medicine

## 2017-05-26 ENCOUNTER — Encounter: Payer: Self-pay | Admitting: Family Medicine

## 2017-05-26 ENCOUNTER — Telehealth: Payer: Self-pay

## 2017-05-26 NOTE — Telephone Encounter (Signed)
Patient called and need pregnancy verification letter. [name] RNBSN

## 2017-06-02 ENCOUNTER — Ambulatory Visit: Payer: 59

## 2017-06-03 ENCOUNTER — Encounter (HOSPITAL_COMMUNITY): Payer: Self-pay | Admitting: Family Medicine

## 2017-06-09 ENCOUNTER — Other Ambulatory Visit: Payer: Self-pay | Admitting: Family Medicine

## 2017-06-09 ENCOUNTER — Ambulatory Visit (HOSPITAL_COMMUNITY)
Admission: RE | Admit: 2017-06-09 | Discharge: 2017-06-09 | Disposition: A | Discharge status: Home / Self Care | Payer: Medicaid Other | Source: Ambulatory Visit | Attending: Family Medicine | Admitting: Family Medicine

## 2017-06-09 DIAGNOSIS — Z3A19 19 weeks gestation of pregnancy: Secondary | ICD-10-CM

## 2017-06-09 DIAGNOSIS — Z3490 Encounter for supervision of normal pregnancy, unspecified, unspecified trimester: Secondary | ICD-10-CM

## 2017-06-09 DIAGNOSIS — O99212 Obesity complicating pregnancy, second trimester: Secondary | ICD-10-CM

## 2017-06-09 DIAGNOSIS — Z3689 Encounter for other specified antenatal screening: Secondary | ICD-10-CM | POA: Insufficient documentation

## 2017-06-13 ENCOUNTER — Ambulatory Visit (HOSPITAL_COMMUNITY): Payer: 59

## 2017-06-13 ENCOUNTER — Ambulatory Visit (INDEPENDENT_AMBULATORY_CARE_PROVIDER_SITE_OTHER): Payer: Medicaid Other | Admitting: Family Medicine

## 2017-06-13 VITALS — BP 123/66 | HR 82 | Wt 206.0 lb

## 2017-06-13 DIAGNOSIS — O99891 Other specified diseases and conditions complicating pregnancy: Secondary | ICD-10-CM

## 2017-06-13 DIAGNOSIS — M549 Dorsalgia, unspecified: Secondary | ICD-10-CM | POA: Diagnosis not present

## 2017-06-13 DIAGNOSIS — O26892 Other specified pregnancy related conditions, second trimester: Secondary | ICD-10-CM | POA: Diagnosis not present

## 2017-06-13 DIAGNOSIS — R12 Heartburn: Secondary | ICD-10-CM

## 2017-06-13 DIAGNOSIS — Z3482 Encounter for supervision of other normal pregnancy, second trimester: Secondary | ICD-10-CM

## 2017-06-13 DIAGNOSIS — Z6833 Body mass index (BMI) 33.0-33.9, adult: Secondary | ICD-10-CM

## 2017-06-13 DIAGNOSIS — Z3480 Encounter for supervision of other normal pregnancy, unspecified trimester: Secondary | ICD-10-CM

## 2017-06-13 DIAGNOSIS — M9903 Segmental and somatic dysfunction of lumbar region: Secondary | ICD-10-CM

## 2017-06-13 DIAGNOSIS — O9989 Other specified diseases and conditions complicating pregnancy, childbirth and the puerperium: Secondary | ICD-10-CM

## 2017-06-13 DIAGNOSIS — E6609 Other obesity due to excess calories: Secondary | ICD-10-CM

## 2017-06-13 MED ORDER — RANITIDINE HCL 150 MG PO CAPS: 150.0000 mg | ORAL_CAPSULE | Freq: Two times a day (BID) | ORAL | 6 refills | Status: DC

## 2017-06-13 MED FILL — raNITIdine HCL 150 MG TABS: 150 | 30 days supply | Qty: 60 | Fill #0

## 2017-06-13 NOTE — Progress Notes (Signed)
Patient states Zantac not helping heartburn. [name] RN

## 2017-06-13 NOTE — Progress Notes (Signed)
   PRENATAL VISIT NOTE  Subjective:  Dejana Pugsley is a 27 y.o. G3P2002 at [redacted]w[redacted]d being seen today for ongoing prenatal care.  She is currently monitored for the following issues for this low-risk pregnancy and has Lack of energy; Other fatigue; Abnormal weight gain; Class 1 obesity due to excess calories without serious comorbidity with body mass index (BMI) of 33.0 to 33.9 in adult; Supervision of other normal pregnancy, antepartum; and Vitamin D deficiency on their problem list.  Patient reports heartburn. Backpain R Lumbar. Radiation to foot.  Contractions: Not present. Vag. Bleeding: None.  Movement: Present. Denies leaking of fluid.   The following portions of the patient's history were reviewed and updated as appropriate: allergies, current medications, past family history, past medical history, past social history, past surgical history and problem list. Problem list updated.  Objective:   Vitals:   06/13/17 0819  BP: 123/66  Pulse: 82  Weight: 206 lb (93.4 kg)    Fetal Status: Fetal Heart Rate (bpm): 145   Movement: Present     General:  Alert, oriented and cooperative. Patient is in no acute distress.  Skin: Skin is warm and dry. No rash noted.   Cardiovascular: Normal heart rate noted  Respiratory: Normal respiratory effort, no problems with respiration noted  Abdomen: Soft, gravid, appropriate for gestational age.  Pain/Pressure: Present     Pelvic: Cervical exam deferred        Extremities: Normal range of motion.  Edema: None  Mental Status: Normal mood and affect. Normal behavior. Normal judgment and thought content.   MSK: Restriction, tenderness, tissue texture changes, and paraspinal spasm in the lumbar spine  Neuro: Moves all four extremities with no focal neurological deficit   OSE: Head   Cervical   Thoracic   Rib   Lumbar L5 ESRR  Sacrum L/L  Pelvis R ant     Assessment and Plan:  Pregnancy: G3P2002 at [redacted]w[redacted]d  1. Supervision of other normal  pregnancy, antepartum FH and FHT normal  2. Class 1 obesity due to excess calories without serious comorbidity with body mass index (BMI) of 33.0 to 33.9 in adult   3. Heartburn in pregnancy in second trimester Increase zantac to BID  4. Back pain affecting pregnancy in second trimester 5. Somatic dysfunction of lumbar region OMT done after patient permission. HVLA technique utilized. 3 areas treated with improvement of tissue texture and joint mobility. Patient tolerated procedure well.     Preterm labor symptoms and general obstetric precautions including but not limited to vaginal bleeding, contractions, leaking of fluid and fetal movement were reviewed in detail with the patient. Please refer to After Visit Summary for other counseling recommendations.  No follow-ups on file.  No future appointments.  Truett Mainland, DO

## 2017-06-15 LAB — AFP TETRA
DIA MOM VALUE: 0.7
DIA Value (EIA): 115 pg/mL
DSR (BY AGE) 1 IN: 850
DSR (Second Trimester) 1 IN: 6346
Gestational Age: 20 WEEKS
MATERNAL AGE AT EDD: 28.1 a
MSAFP Mom: 0.79
MSAFP: 39.9 ng/mL
MSHCG MOM: 1.48
MSHCG: 28082 m[IU]/mL
Osb Risk: 10000
T18 (By Age): 1:3310 {titer}
TEST RESULTS AFP: NEGATIVE
WEIGHT: 206 [lb_av]
uE3 Mom: 0.96
uE3 Value: 1.61 ng/mL

## 2017-07-11 ENCOUNTER — Ambulatory Visit (INDEPENDENT_AMBULATORY_CARE_PROVIDER_SITE_OTHER): Payer: Medicaid Other | Admitting: Family Medicine

## 2017-07-11 VITALS — BP 116/67 | HR 82 | Wt 211.0 lb

## 2017-07-11 DIAGNOSIS — O9989 Other specified diseases and conditions complicating pregnancy, childbirth and the puerperium: Secondary | ICD-10-CM

## 2017-07-11 DIAGNOSIS — O99891 Other specified diseases and conditions complicating pregnancy: Secondary | ICD-10-CM

## 2017-07-11 DIAGNOSIS — Z6833 Body mass index (BMI) 33.0-33.9, adult: Secondary | ICD-10-CM

## 2017-07-11 DIAGNOSIS — M9903 Segmental and somatic dysfunction of lumbar region: Secondary | ICD-10-CM

## 2017-07-11 DIAGNOSIS — Z331 Pregnant state, incidental: Secondary | ICD-10-CM | POA: Diagnosis not present

## 2017-07-11 DIAGNOSIS — E6609 Other obesity due to excess calories: Secondary | ICD-10-CM

## 2017-07-11 DIAGNOSIS — M549 Dorsalgia, unspecified: Secondary | ICD-10-CM

## 2017-07-11 DIAGNOSIS — Z3482 Encounter for supervision of other normal pregnancy, second trimester: Secondary | ICD-10-CM

## 2017-07-11 DIAGNOSIS — Z3480 Encounter for supervision of other normal pregnancy, unspecified trimester: Secondary | ICD-10-CM

## 2017-07-11 NOTE — Progress Notes (Signed)
   PRENATAL VISIT NOTE  Subjective:  Penny Klein is a 27 y.o. G3P2002 at [redacted]w[redacted]d being seen today for ongoing prenatal care.  She is currently monitored for the following issues for this low-risk pregnancy and has Lack of energy; Other fatigue; Abnormal weight gain; Class 1 obesity due to excess calories without serious comorbidity with body mass index (BMI) of 33.0 to 33.9 in adult; Supervision of other normal pregnancy, antepartum; and Vitamin D deficiency on their problem list.  Patient reports intermittent backache in lumbosacral area. no radiation. Start two weeks ago. No injury..  Contractions: Not present. Vag. Bleeding: None.  Movement: Present. Denies leaking of fluid.   The following portions of the patient's history were reviewed and updated as appropriate: allergies, current medications, past family history, past medical history, past social history, past surgical history and problem list. Problem list updated.  Objective:   Vitals:   07/11/17 0806  BP: 116/67  Pulse: 82  Weight: 211 lb (95.7 kg)    Fetal Status: Fetal Heart Rate (bpm): 155 Fundal Height: 24 cm Movement: Present     General:  Alert, oriented and cooperative. Patient is in no acute distress.  Skin: Skin is warm and dry. No rash noted.   Cardiovascular: Normal heart rate noted  Respiratory: Normal respiratory effort, no problems with respiration noted  Abdomen: Soft, gravid, appropriate for gestational age. Pain/Pressure: Present     Pelvic:  Cervical exam deferred        MSK: Restriction, tenderness, tissue texture changes, and paraspinal spasm in the lumbar spine  Neuro: Moves all four extremities with no focal neurological deficit  Extremities: Normal range of motion.  Edema: None  Mental Status: Normal mood and affect. Normal behavior. Normal judgment and thought content.   OSE: Head   Cervical   Thoracic   Rib   Lumbar L1 ESRR, L5 ESRL  Sacrum L/L  Pelvis R ant innom    Assessment and Plan:    Pregnancy: G3P2002 at [redacted]w[redacted]d  1. Supervision of other normal pregnancy, antepartum FHT and FH normal. Return in 2wks for 2hr GTT.  2. Class 1 obesity due to excess calories without serious comorbidity with body mass index (BMI) of 33.0 to 33.9 in adult  3. Back pain affecting pregnancy in second trimester 4. Somatic dysfunction of lumbar region OMT done after patient permission. HVLA technique utilized. 3 areas treated with improvement of tissue texture and joint mobility. Patient tolerated procedure well.    Preterm labor symptoms and general obstetric precautions including but not limited to vaginal bleeding, contractions, leaking of fluid and fetal movement were reviewed in detail with the patient. Please refer to After Visit Summary for other counseling recommendations.  Return in about 1 month (around 08/08/2017) for OB f/u.  Truett Mainland, DO

## 2017-07-23 ENCOUNTER — Encounter: Payer: Self-pay | Admitting: Family Medicine

## 2017-07-24 ENCOUNTER — Ambulatory Visit (INDEPENDENT_AMBULATORY_CARE_PROVIDER_SITE_OTHER): Payer: Medicaid Other | Admitting: Family Medicine

## 2017-07-24 VITALS — BP 112/66 | HR 103 | Wt 209.1 lb

## 2017-07-24 DIAGNOSIS — E6609 Other obesity due to excess calories: Secondary | ICD-10-CM

## 2017-07-24 DIAGNOSIS — Z6833 Body mass index (BMI) 33.0-33.9, adult: Secondary | ICD-10-CM

## 2017-07-24 DIAGNOSIS — O99891 Other specified diseases and conditions complicating pregnancy: Secondary | ICD-10-CM

## 2017-07-24 DIAGNOSIS — M9903 Segmental and somatic dysfunction of lumbar region: Secondary | ICD-10-CM

## 2017-07-24 DIAGNOSIS — Z3480 Encounter for supervision of other normal pregnancy, unspecified trimester: Secondary | ICD-10-CM

## 2017-07-24 DIAGNOSIS — O9989 Other specified diseases and conditions complicating pregnancy, childbirth and the puerperium: Secondary | ICD-10-CM

## 2017-07-24 DIAGNOSIS — M549 Dorsalgia, unspecified: Secondary | ICD-10-CM | POA: Diagnosis not present

## 2017-07-24 DIAGNOSIS — Z3482 Encounter for supervision of other normal pregnancy, second trimester: Secondary | ICD-10-CM

## 2017-07-24 MED ORDER — CYCLOBENZAPRINE HCL 10 MG PO TABS: 5.0000 mg | ORAL_TABLET | Freq: Three times a day (TID) | ORAL | 1 refills | Status: DC | PRN

## 2017-07-24 MED FILL — CYCLOBENZAPRINE HCL 10 MG T: 10 | 10 days supply | Qty: 30 | Fill #0

## 2017-07-24 NOTE — Progress Notes (Signed)
   PRENATAL VISIT NOTE  Subjective:  Penny Klein is a 27 y.o. G3P2002 at [redacted]w[redacted]d being seen today for ongoing prenatal care.  She is currently monitored for the following issues for this low-risk pregnancy and has Lack of energy; Other fatigue; Abnormal weight gain; Class 1 obesity due to excess calories without serious comorbidity with body mass index (BMI) of 33.0 to 33.9 in adult; Supervision of other normal pregnancy, antepartum; and Vitamin D deficiency on their problem list.  Patient reports backpain. Improved some with OMT last visit, but worsened. Having some psoas pain on the left as well. Had difficulty sleeping last night..  Contractions: Not present. Vag. Bleeding: None.  Movement: Present. Denies leaking of fluid.   The following portions of the patient's history were reviewed and updated as appropriate: allergies, current medications, past family history, past medical history, past social history, past surgical history and problem list. Problem list updated.  Objective:   Vitals:   07/24/17 0909  BP: 112/66  Pulse: (!) 103  Weight: 209 lb 1.6 oz (94.8 kg)    Fetal Status: Fetal Heart Rate (bpm): 155   Movement: Present     General:  Alert, oriented and cooperative. Patient is in no acute distress.  Skin: Skin is warm and dry. No rash noted.   Cardiovascular: Normal heart rate noted  Respiratory: Normal respiratory effort, no problems with respiration noted  Abdomen: Soft, gravid, appropriate for gestational age. Pain/Pressure: Present     Pelvic:  Cervical exam deferred        MSK: Restriction, tenderness, tissue texture changes, and paraspinal spasm in the left psoas  Neuro: Moves all four extremities with no focal neurological deficit  Extremities: Normal range of motion.  Edema: None  Mental Status: Normal mood and affect. Normal behavior. Normal judgment and thought content.   OSE: Head   Cervical   Thoracic   Rib   Lumbar L1 ESRR, L5 ESRL  Sacrum L/L  Pelvis  Right ant innom    Assessment and Plan:  Pregnancy: G3P2002 at [redacted]w[redacted]d  1. Supervision of other normal pregnancy, antepartum FHT and FH normal  2. Class 1 obesity due to excess calories without serious comorbidity with body mass index (BMI) of 33.0 to 33.9 in adult  3. Back pain affecting pregnancy in second trimester 4. Somatic dysfunction of lumbar region OMT done after patient permission. HVLA technique utilized. 3 areas treated with improvement of tissue texture and joint mobility. Patient tolerated procedure well. Flexeril for muscle spasm.   Preterm labor symptoms and general obstetric precautions including but not limited to vaginal bleeding, contractions, leaking of fluid and fetal movement were reviewed in detail with the patient. Please refer to After Visit Summary for other counseling recommendations.  No follow-ups on file.  Truett Mainland, DO

## 2017-07-25 ENCOUNTER — Ambulatory Visit (INDEPENDENT_AMBULATORY_CARE_PROVIDER_SITE_OTHER): Payer: Medicaid Other | Admitting: Family Medicine

## 2017-07-25 ENCOUNTER — Other Ambulatory Visit: Payer: Medicaid Other

## 2017-07-25 VITALS — BP 119/73 | HR 98 | Wt 207.1 lb

## 2017-07-25 DIAGNOSIS — N949 Unspecified condition associated with female genital organs and menstrual cycle: Secondary | ICD-10-CM

## 2017-07-25 DIAGNOSIS — Z3480 Encounter for supervision of other normal pregnancy, unspecified trimester: Secondary | ICD-10-CM

## 2017-07-25 DIAGNOSIS — Z3482 Encounter for supervision of other normal pregnancy, second trimester: Secondary | ICD-10-CM

## 2017-07-25 NOTE — Progress Notes (Signed)
   PRENATAL VISIT NOTE  Subjective:  Penny Klein is a 27 y.o. G3P2002 at [redacted]w[redacted]d being seen today for ongoing prenatal care.  She is currently monitored for the following issues for this low-risk pregnancy and has Lack of energy; Other fatigue; Abnormal weight gain; Class 1 obesity due to excess calories without serious comorbidity with body mass index (BMI) of 33.0 to 33.9 in adult; Supervision of other normal pregnancy, antepartum; and Vitamin D deficiency on their problem list.  Patient reports lower abdominal pain - back pain improved, but still having the abdominal pain. Worse when walking.  Contractions: Not present. Vag. Bleeding: None.  Movement: Present. Denies leaking of fluid.   The following portions of the patient's history were reviewed and updated as appropriate: allergies, current medications, past family history, past medical history, past social history, past surgical history and problem list. Problem list updated.  Objective:   Vitals:   07/25/17 1023  BP: 119/73  Pulse: 98  Weight: 207 lb 1.6 oz (93.9 kg)    Fetal Status: Fetal Heart Rate (bpm): 154   Movement: Present     General:  Alert, oriented and cooperative. Patient is in no acute distress.  Skin: Skin is warm and dry. No rash noted.   Cardiovascular: Normal heart rate noted  Respiratory: Normal respiratory effort, no problems with respiration noted  Abdomen: Soft, gravid, appropriate for gestational age.  Pain/Pressure: Present  Round ligament pain present   Pelvic: Cervical exam deferred        Extremities: Normal range of motion.  Edema: None  Mental Status: Normal mood and affect. Normal behavior. Normal judgment and thought content.   Assessment and Plan:  Pregnancy: G3P2002 at [redacted]w[redacted]d  1. Supervision of other normal pregnancy, antepartum 2. Round ligament pain Abdominal support band  Preterm labor symptoms and general obstetric precautions including but not limited to vaginal bleeding,  contractions, leaking of fluid and fetal movement were reviewed in detail with the patient. Please refer to After Visit Summary for other counseling recommendations.  No follow-ups on file.  Future Appointments  Date Time Provider [place]  08/08/2017  9:00 AM Lavonia Drafts, MD [facility] None    Truett Mainland, DO

## 2017-07-30 ENCOUNTER — Ambulatory Visit: Payer: Medicaid Other

## 2017-07-30 DIAGNOSIS — Z3490 Encounter for supervision of normal pregnancy, unspecified, unspecified trimester: Secondary | ICD-10-CM

## 2017-07-30 MED FILL — raNITIdine HCL 150 MG TABS: 150 | 30 days supply | Qty: 60 | Fill #1

## 2017-07-30 NOTE — Progress Notes (Signed)
Patient came in for gtt. Patient sent up to lab. [name] RN

## 2017-07-31 LAB — GLUCOSE TOLERANCE, 2 HOURS W/ 1HR
GLUCOSE, FASTING: 67 mg/dL (ref 65–91)
Glucose, 1 hour: 97 mg/dL (ref 65–179)
Glucose, 2 hour: 53 mg/dL — ABNORMAL LOW (ref 65–152)

## 2017-07-31 LAB — RPR: RPR Ser Ql: NONREACTIVE

## 2017-07-31 LAB — CBC
HEMATOCRIT: 36.2 % (ref 34.0–46.6)
HEMOGLOBIN: 12.4 g/dL (ref 11.1–15.9)
MCH: 30.2 pg (ref 26.6–33.0)
MCHC: 34.3 g/dL (ref 31.5–35.7)
MCV: 88 fL (ref 79–97)
Platelets: 252 10*3/uL (ref 150–450)
RBC: 4.1 x10E6/uL (ref 3.77–5.28)
RDW: 14.4 % (ref 12.3–15.4)
WBC: 9.4 10*3/uL (ref 3.4–10.8)

## 2017-07-31 LAB — HIV ANTIBODY (ROUTINE TESTING W REFLEX): HIV SCREEN 4TH GENERATION: NONREACTIVE

## 2017-08-08 ENCOUNTER — Encounter: Payer: Self-pay | Admitting: Obstetrics & Gynecology

## 2017-08-08 ENCOUNTER — Ambulatory Visit (INDEPENDENT_AMBULATORY_CARE_PROVIDER_SITE_OTHER): Payer: Medicaid Other | Admitting: Obstetrics & Gynecology

## 2017-08-08 VITALS — BP 105/60 | HR 84 | Wt 213.0 lb

## 2017-08-08 DIAGNOSIS — Z3483 Encounter for supervision of other normal pregnancy, third trimester: Secondary | ICD-10-CM

## 2017-08-08 DIAGNOSIS — Z23 Encounter for immunization: Secondary | ICD-10-CM | POA: Diagnosis not present

## 2017-08-08 DIAGNOSIS — O99213 Obesity complicating pregnancy, third trimester: Secondary | ICD-10-CM

## 2017-08-08 DIAGNOSIS — E6609 Other obesity due to excess calories: Secondary | ICD-10-CM

## 2017-08-08 DIAGNOSIS — Z3480 Encounter for supervision of other normal pregnancy, unspecified trimester: Secondary | ICD-10-CM

## 2017-08-08 DIAGNOSIS — E559 Vitamin D deficiency, unspecified: Secondary | ICD-10-CM

## 2017-08-08 DIAGNOSIS — Z6833 Body mass index (BMI) 33.0-33.9, adult: Secondary | ICD-10-CM

## 2017-08-08 NOTE — Progress Notes (Signed)
   PRENATAL VISIT NOTE  Subjective:  Penny Klein is a 27 y.o. G3P2002 at [redacted]w[redacted]d being seen today for ongoing prenatal care.  She is currently monitored for the following issues for this low-risk pregnancy and has Lack of energy; Other fatigue; Abnormal weight gain; Class 1 obesity due to excess calories without serious comorbidity with body mass index (BMI) of 33.0 to 33.9 in adult; Supervision of other normal pregnancy, antepartum; and Vitamin D deficiency on their problem list.  Patient reports no complaints.  Contractions: Not present. Vag. Bleeding: None.  Movement: Present. Denies leaking of fluid.   The following portions of the patient's history were reviewed and updated as appropriate: allergies, current medications, past family history, past medical history, past social history, past surgical history and problem list. Problem list updated.  Objective:   Vitals:   08/08/17 0935  BP: 105/60  Pulse: 84  Weight: 213 lb (96.6 kg)    Fetal Status:     Movement: Present     General:  Alert, oriented and cooperative. Patient is in no acute distress.  Skin: Skin is warm and dry. No rash noted.   Cardiovascular: Normal heart rate noted  Respiratory: Normal respiratory effort, no problems with respiration noted  Abdomen: Soft, gravid, appropriate for gestational age.  Pain/Pressure: Absent     Pelvic: Cervical exam deferred        Extremities: Normal range of motion.  Edema: None  Mental Status: Normal mood and affect. Normal behavior. Normal judgment and thought content.   Assessment and Plan:  Pregnancy: G3P2002 at [redacted]w[redacted]d  1. Supervision of other normal pregnancy, antepartum BTL papers signed today  2. Vitamin D deficiency  3. Class 1 obesity due to excess calories without serious comorbidity with body mass index (BMI) of 33.0 to 33.9 in adult  Preterm labor symptoms and general obstetric precautions including but not limited to vaginal bleeding, contractions, leaking of  fluid and fetal movement were reviewed in detail with the patient. Please refer to After Visit Summary for other counseling recommendations.  Return in about 2 weeks (around 08/22/2017).  No future appointments.  Lavonia Drafts, MD

## 2017-08-21 ENCOUNTER — Ambulatory Visit (INDEPENDENT_AMBULATORY_CARE_PROVIDER_SITE_OTHER): Payer: Medicaid Other | Admitting: Obstetrics & Gynecology

## 2017-08-21 VITALS — BP 103/59 | HR 86 | Wt 216.0 lb

## 2017-08-21 DIAGNOSIS — Z3480 Encounter for supervision of other normal pregnancy, unspecified trimester: Secondary | ICD-10-CM

## 2017-08-21 DIAGNOSIS — Z6833 Body mass index (BMI) 33.0-33.9, adult: Secondary | ICD-10-CM

## 2017-08-21 DIAGNOSIS — E559 Vitamin D deficiency, unspecified: Secondary | ICD-10-CM

## 2017-08-21 DIAGNOSIS — R635 Abnormal weight gain: Secondary | ICD-10-CM

## 2017-08-21 DIAGNOSIS — E6609 Other obesity due to excess calories: Secondary | ICD-10-CM

## 2017-08-21 NOTE — Progress Notes (Signed)
   PRENATAL VISIT NOTE  Subjective:  Penny Klein is a 27 y.o. G3P2002 at [redacted]w[redacted]d being seen today for ongoing prenatal care.  She is currently monitored for the following issues for this low-risk pregnancy and has Lack of energy; Other fatigue; Abnormal weight gain; Class 1 obesity due to excess calories without serious comorbidity with body mass index (BMI) of 33.0 to 33.9 in adult; Supervision of other normal pregnancy, antepartum; and Vitamin D deficiency on their problem list.  Patient reports no complaints.  Contractions: Not present. Vag. Bleeding: None.  Movement: Present. Denies leaking of fluid.   The following portions of the patient's history were reviewed and updated as appropriate: allergies, current medications, past family history, past medical history, past social history, past surgical history and problem list. Problem list updated.  Objective:   Vitals:   08/21/17 0931  BP: (!) 103/59  Pulse: 86  Weight: 216 lb (98 kg)    Fetal Status: Fetal Heart Rate (bpm): 150   Movement: Present     General:  Alert, oriented and cooperative. Patient is in no acute distress.  Skin: Skin is warm and dry. No rash noted.   Cardiovascular: Normal heart rate noted  Respiratory: Normal respiratory effort, no problems with respiration noted  Abdomen: Soft, gravid, appropriate for gestational age.  Pain/Pressure: Present     Pelvic: Cervical exam deferred        Extremities: Normal range of motion.  Edema: None  Mental Status: Normal mood and affect. Normal behavior. Normal judgment and thought content.   Assessment and Plan:  Pregnancy: G3P2002 at [redacted]w[redacted]d  1. Abnormal weight gain   2. Supervision of other normal pregnancy, antepartum   3. Class 1 obesity due to excess calories without serious comorbidity with body mass index (BMI) of 33.0 to 33.9 in adult   4. Vitamin D deficiency  - Vitamin D (25 hydroxy)  Preterm labor symptoms and general obstetric precautions including  but not limited to vaginal bleeding, contractions, leaking of fluid and fetal movement were reviewed in detail with the patient. Please refer to After Visit Summary for other counseling recommendations.  Return in about 2 weeks (around 09/04/2017).  No future appointments.  Emily Filbert, MD

## 2017-09-04 ENCOUNTER — Ambulatory Visit (INDEPENDENT_AMBULATORY_CARE_PROVIDER_SITE_OTHER): Payer: Medicaid Other | Admitting: Family Medicine

## 2017-09-04 VITALS — BP 108/71 | HR 90 | Wt 218.0 lb

## 2017-09-04 DIAGNOSIS — E6609 Other obesity due to excess calories: Secondary | ICD-10-CM

## 2017-09-04 DIAGNOSIS — Z6833 Body mass index (BMI) 33.0-33.9, adult: Secondary | ICD-10-CM

## 2017-09-04 DIAGNOSIS — Z3480 Encounter for supervision of other normal pregnancy, unspecified trimester: Secondary | ICD-10-CM

## 2017-09-04 NOTE — Progress Notes (Signed)
   PRENATAL VISIT NOTE  Subjective:  Penny Klein is a 27 y.o. G3P2002 at [redacted]w[redacted]d being seen today for ongoing prenatal care.  She is currently monitored for the following issues for this low-risk pregnancy and has Lack of energy; Other fatigue; Abnormal weight gain; Class 1 obesity due to excess calories without serious comorbidity with body mass index (BMI) of 33.0 to 33.9 in adult; Supervision of other normal pregnancy, antepartum; and Vitamin D deficiency on their problem list.  Patient reports no complaints.  Contractions: Irritability. Vag. Bleeding: None.  Movement: Present. Denies leaking of fluid.   The following portions of the patient's history were reviewed and updated as appropriate: allergies, current medications, past family history, past medical history, past social history, past surgical history and problem list. Problem list updated.  Objective:   Vitals:   09/04/17 0942  BP: 108/71  Pulse: 90  Weight: 218 lb (98.9 kg)    Fetal Status: Fetal Heart Rate (bpm): 152 Fundal Height: 36 cm Movement: Present     General:  Alert, oriented and cooperative. Patient is in no acute distress.  Skin: Skin is warm and dry. No rash noted.   Cardiovascular: Normal heart rate noted  Respiratory: Normal respiratory effort, no problems with respiration noted  Abdomen: Soft, gravid, appropriate for gestational age.  Pain/Pressure: Present     Pelvic: Cervical exam deferred        Extremities: Normal range of motion.  Edema: None  Mental Status: Normal mood and affect. Normal behavior. Normal judgment and thought content.   Assessment and Plan:  Pregnancy: G3P2002 at [redacted]w[redacted]d  1. Supervision of other normal pregnancy, antepartum FHT and FH normal  2. Class 1 obesity due to excess calories without serious comorbidity with body mass index (BMI) of 33.0 to 33.9 in adult   Preterm labor symptoms and general obstetric precautions including but not limited to vaginal bleeding, contractions,  leaking of fluid and fetal movement were reviewed in detail with the patient. Please refer to After Visit Summary for other counseling recommendations.  Return in about 2 weeks (around 09/18/2017) for OB f/u.  No future appointments.  Truett Mainland, DO

## 2017-09-18 ENCOUNTER — Ambulatory Visit (INDEPENDENT_AMBULATORY_CARE_PROVIDER_SITE_OTHER): Payer: Medicaid Other | Admitting: Family Medicine

## 2017-09-18 VITALS — BP 111/64 | HR 94 | Wt 221.0 lb

## 2017-09-18 DIAGNOSIS — Z6833 Body mass index (BMI) 33.0-33.9, adult: Secondary | ICD-10-CM

## 2017-09-18 DIAGNOSIS — Z3480 Encounter for supervision of other normal pregnancy, unspecified trimester: Secondary | ICD-10-CM

## 2017-09-18 DIAGNOSIS — E6609 Other obesity due to excess calories: Secondary | ICD-10-CM

## 2017-09-18 MED ORDER — PANTOPRAZOLE SODIUM 40 MG PO TBEC: 40.0000 mg | DELAYED_RELEASE_TABLET | Freq: Every day | ORAL | 3 refills | Status: DC

## 2017-09-18 MED FILL — PANTOPRAZOLE SOD DR 40 MG T: 40 | 30 days supply | Qty: 30 | Fill #0

## 2017-09-18 NOTE — Progress Notes (Signed)
   PRENATAL VISIT NOTE  Subjective:  Penny Klein is a 27 y.o. G3P2002 at [redacted]w[redacted]d being seen today for ongoing prenatal care.  She is currently monitored for the following issues for this low-risk pregnancy and has Lack of energy; Other fatigue; Abnormal weight gain; Class 1 obesity due to excess calories without serious comorbidity with body mass index (BMI) of 33.0 to 33.9 in adult; Supervision of other normal pregnancy, antepartum; and Vitamin D deficiency on their problem list.  Patient reports fell in shower, but did not hit abdomen. No bleeding, ctx. Good fetal movement..  Contractions: Irritability. Vag. Bleeding: None.  Movement: Present. Denies leaking of fluid.   The following portions of the patient's history were reviewed and updated as appropriate: allergies, current medications, past family history, past medical history, past social history, past surgical history and problem list. Problem list updated.  Objective:   Vitals:   09/18/17 0814  BP: 111/64  Pulse: 94  Weight: 221 lb (100.2 kg)    Fetal Status: Fetal Heart Rate (bpm): 151 Fundal Height: 37 cm Movement: Present     General:  Alert, oriented and cooperative. Patient is in no acute distress.  Skin: Skin is warm and dry. No rash noted.   Cardiovascular: Normal heart rate noted  Respiratory: Normal respiratory effort, no problems with respiration noted  Abdomen: Soft, gravid, appropriate for gestational age.  Pain/Pressure: Absent     Pelvic: Cervical exam deferred        Extremities: Normal range of motion.  Edema: None  Mental Status: Normal mood and affect. Normal behavior. Normal judgment and thought content.   Assessment and Plan:  Pregnancy: G3P2002 at [redacted]w[redacted]d  1. Supervision of other normal pregnancy, antepartum FHT and FH normal  2. Class 1 obesity due to excess calories without serious comorbidity with body mass index (BMI) of 33.0 to 33.9 in adult   Preterm labor symptoms and general obstetric  precautions including but not limited to vaginal bleeding, contractions, leaking of fluid and fetal movement were reviewed in detail with the patient. Please refer to After Visit Summary for other counseling recommendations.  Return in about 2 weeks (around 10/02/2017) for OB f/u.  No future appointments.  Truett Mainland, DO

## 2017-09-18 NOTE — Progress Notes (Signed)
Pt states that she fell getting out of the shower yesterday.Pt states that she hit her left leg and ride side.

## 2017-09-18 NOTE — Addendum Note (Signed)
Addended by: Truett Mainland on: 09/18/2017 08:59 AM   Modules accepted: Orders

## 2017-10-02 ENCOUNTER — Ambulatory Visit (INDEPENDENT_AMBULATORY_CARE_PROVIDER_SITE_OTHER): Payer: Medicaid Other | Admitting: Obstetrics & Gynecology

## 2017-10-02 ENCOUNTER — Other Ambulatory Visit (HOSPITAL_COMMUNITY)
Admission: RE | Admit: 2017-10-02 | Discharge: 2017-10-02 | Disposition: A | Discharge status: Home / Self Care | Payer: Medicaid Other | Source: Ambulatory Visit | Attending: Obstetrics & Gynecology | Admitting: Obstetrics & Gynecology

## 2017-10-02 VITALS — BP 115/69 | HR 92 | Wt 224.0 lb

## 2017-10-02 DIAGNOSIS — Z3A36 36 weeks gestation of pregnancy: Secondary | ICD-10-CM | POA: Insufficient documentation

## 2017-10-02 DIAGNOSIS — E559 Vitamin D deficiency, unspecified: Secondary | ICD-10-CM

## 2017-10-02 DIAGNOSIS — Z9889 Other specified postprocedural states: Secondary | ICD-10-CM

## 2017-10-02 DIAGNOSIS — Z3490 Encounter for supervision of normal pregnancy, unspecified, unspecified trimester: Secondary | ICD-10-CM

## 2017-10-02 DIAGNOSIS — Z3483 Encounter for supervision of other normal pregnancy, third trimester: Secondary | ICD-10-CM | POA: Insufficient documentation

## 2017-10-02 DIAGNOSIS — Z3480 Encounter for supervision of other normal pregnancy, unspecified trimester: Secondary | ICD-10-CM

## 2017-10-02 DIAGNOSIS — O3440 Maternal care for other abnormalities of cervix, unspecified trimester: Secondary | ICD-10-CM

## 2017-10-02 LAB — OB RESULTS CONSOLE GBS: GBS: POSITIVE

## 2017-10-02 LAB — OB RESULTS CONSOLE GC/CHLAMYDIA: GC PROBE AMP, GENITAL: NEGATIVE

## 2017-10-02 NOTE — Progress Notes (Signed)
   PRENATAL VISIT NOTE  Subjective:  Penny Klein is a 27 y.o. G3P2002 at [redacted]w[redacted]d being seen today for ongoing prenatal care.  She is currently monitored for the following issues for this low-risk pregnancy and has Abnormal weight gain; Class 1 obesity due to excess calories without serious comorbidity with body mass index (BMI) of 33.0 to 33.9 in adult; Supervision of other normal pregnancy, antepartum; and Vitamin D deficiency on their problem list.  Patient reports no complaints.  Contractions: Irritability. Vag. Bleeding: None.  Movement: Present. Denies leaking of fluid.   The following portions of the patient's history were reviewed and updated as appropriate: allergies, current medications, past family history, past medical history, past social history, past surgical history and problem list. Problem list updated.  Objective:   Vitals:   10/02/17 1033  BP: 115/69  Pulse: 92  Weight: 224 lb (101.6 kg)    Fetal Status:     Movement: Present     General:  Alert, oriented and cooperative. Patient is in no acute distress.  Skin: Skin is warm and dry. No rash noted.   Cardiovascular: Normal heart rate noted  Respiratory: Normal respiratory effort, no problems with respiration noted  Abdomen: Soft, gravid, appropriate for gestational age.  Pain/Pressure: Present     Pelvic: Cervical exam performed        Extremities: Normal range of motion.  Edema: None  Mental Status: Normal mood and affect. Normal behavior. Normal judgment and thought content.   Assessment and Plan:  Pregnancy: G3P2002 at [redacted]w[redacted]d  1. Prenatal care, antepartum  - GC/Chlamydia probe amp ([place])not at [facility] - Culture, beta strep (group b only)  2. Supervision of other normal pregnancy, antepartum  3. Vitamin D deficiency  4. H/o LEEP Pt is worried about not dilating due to the scar tissue. Reviewed labor after a LEEP   Preterm labor symptoms and general obstetric precautions including but not limited to  vaginal bleeding, contractions, leaking of fluid and fetal movement were reviewed in detail with the patient. Please refer to After Visit Summary for other counseling recommendations.  Return in about 1 week (around 10/09/2017).  No future appointments.  Lavonia Drafts, MD

## 2017-10-02 NOTE — Patient Instructions (Signed)
Third Trimester of Pregnancy The third trimester is from week 29 through week 42, months 7 through 9. This trimester is when your unborn baby (fetus) is growing very fast. At the end of the ninth month, the unborn baby is about 20 inches in length. It weighs about 6-10 pounds. Follow these instructions at home:  Avoid all smoking, herbs, and alcohol. Avoid drugs not approved by your doctor.  Do not use any tobacco products, including cigarettes, chewing tobacco, and electronic cigarettes. If you need help quitting, ask your doctor. You may get counseling or other support to help you quit.  Only take medicine as told by your doctor. Some medicines are safe and some are not during pregnancy.  Exercise only as told by your doctor. Stop exercising if you start having cramps.  Eat regular, healthy meals.  Wear a good support bra if your breasts are tender.  Do not use hot tubs, steam rooms, or saunas.  Wear your seat belt when driving.  Avoid raw meat, uncooked cheese, and liter boxes and soil used by cats.  Take your prenatal vitamins.  Take 1500-2000 milligrams of calcium daily starting at the 20th week of pregnancy until you deliver your baby.  Try taking medicine that helps you poop (stool softener) as needed, and if your doctor approves. Eat more fiber by eating fresh fruit, vegetables, and whole grains. Drink enough fluids to keep your pee (urine) clear or pale yellow.  Take warm water baths (sitz baths) to soothe pain or discomfort caused by hemorrhoids. Use hemorrhoid cream if your doctor approves.  If you have puffy, bulging veins (varicose veins), wear support hose. Raise (elevate) your feet for 15 minutes, 3-4 times a day. Limit salt in your diet.  Avoid heavy lifting, wear low heels, and sit up straight.  Rest with your legs raised if you have leg cramps or low back pain.  Visit your dentist if you have not gone during your pregnancy. Use a soft toothbrush to brush your  teeth. Be gentle when you floss.  You can have sex (intercourse) unless your doctor tells you not to.  Do not travel far distances unless you must. Only do so with your doctor's approval.  Take prenatal classes.  Practice driving to the hospital.  Pack your hospital bag.  Prepare the baby's room.  Go to your doctor visits. Get help if:  You are not sure if you are in labor or if your water has broken.  You are dizzy.  You have mild cramps or pressure in your lower belly (abdominal).  You have a nagging pain in your belly area.  You continue to feel sick to your stomach (nauseous), throw up (vomit), or have watery poop (diarrhea).  You have bad smelling fluid coming from your vagina.  You have pain with peeing (urination). Get help right away if:  You have a fever.  You are leaking fluid from your vagina.  You are spotting or bleeding from your vagina.  You have severe belly cramping or pain.  You lose or gain weight rapidly.  You have trouble catching your breath and have chest pain.  You notice sudden or extreme puffiness (swelling) of your face, hands, ankles, feet, or legs.  You have not felt the baby move in over an hour.  You have severe headaches that do not go away with medicine.  You have vision changes. This information is not intended to replace advice given to you by your health care provider. Make   sure you discuss any questions you have with your health care provider. Document Released: 05/22/2009 Document Revised: 08/03/2015 Document Reviewed: 04/28/2012 Elsevier Interactive Patient Education  2017 [place]. Pain Relief During Labor and Delivery Many things can cause pain during labor and delivery, including:  Pressure on bones and ligaments due to the baby moving through the pelvis.  Stretching of tissues due to the baby moving through the birth canal.  Muscle tension due to anxiety or nervousness.  The uterus tightening (contracting)  and relaxing to help move the baby.  There are many ways to deal with the pain of labor and delivery. They include:  Taking prenatal classes. Taking these classes helps you know what to expect during your baby's birth. What you learn will increase your confidence and decrease your anxiety.  Practicing relaxation techniques or doing relaxing activities, such as: ? Focused breathing. ? Meditation. ? Visualization. ? Aroma therapy. ? Listening to your favorite music. ? Hypnosis.  Taking a warm shower or bath (hydrotherapy). This may: ? Provide comfort and relaxation. ? Lessen your perception of pain. ? Decrease the amount of pain medicine needed. ? Decrease the length of labor.  Getting a massage or counterpressure on your back.  Applying warm packs or ice packs.  Changing positions often, moving around, or using a birthing ball.  Getting: ? Pain medicine through an IV or injection into a muscle. ? Pain medicine inserted into your spinal column. ? Injections of sterile water just under the skin on your lower back (intradermal injections). ? Laughing gas (nitrous oxide).  Discuss your pain control options with your health care provider during your prenatal visits. Explore the options offered by your hospital or birth center. What kinds of medicine are available? There are two kinds of medicines that can be used to relieve pain during labor and delivery:  Analgesics. These medicines decrease pain without causing you to lose feeling or the ability to move your muscles.  Anesthetics. These medicines block feeling in the body and can decrease your ability to move freely.  Both of these kinds of medicine can cause minor side effects, such as nausea, trouble concentrating, and sleepiness. They can also decrease the baby's heart rate before birth and affect the baby's breathing rate after birth. For this reason, health care providers are careful about when and how much medicine is  given. What are specific medicines and procedures that provide pain relief? Local Anesthetics Local anesthetics are used to numb a small area of the body. They may be used along with another kind of anesthetic or used to numb the nerves of the vagina, cervix, and perineum during the second stage of labor. General Anesthetics General anesthetics cause you to lose consciousness so you do not feel pain. They are usually only used for an emergency cesarean delivery. General anesthetics are given through an IV tube and a mask. Pudendal Block A pudendal block is a form of local anesthetic. It may be used to relieve the pain associated with pushing or stretching of the perineum at the time of delivery or to further numb the perineum. A pudendal block is done by injecting numbing medicine through the vaginal wall into a nerve in the pelvis. Epidural Analgesia Epidural analgesia is given through a flexible IV catheter that is inserted into the lower back. Numbing medicine is delivered continuously to the area near your spinal column nerves (epidural space). After having this type of analgesia, you may be able to move your legs but you  most likely will not be able to walk. Depending on the amount of medicine given, you may lose all feeling in the lower half of your body, or you may retain some level of sensation, including the urge to push. Epidural analgesia can be used to provide pain relief for a vaginal birth. Spinal Block A spinal block is similar to epidural analgesia, but the medicine is injected into the spinal fluid instead of the epidural space. A spinal block is only given once. It starts to relieve pain quickly, but the pain relief lasts only 1-6 hours. Spinal blocks can be used for cesarean deliveries. Combined Spinal-Epidural (CSE) Block A CSE block combines the effects of a spinal block and epidural analgesia. The spinal block works quickly to block all pain. The epidural analgesia provides  continuous pain relief, even after the effects of the spinal block have worn off. This information is not intended to replace advice given to you by your health care provider. Make sure you discuss any questions you have with your health care provider. Document Released: 06/13/2008 Document Revised: 08/04/2015 Document Reviewed: 07/19/2015 Elsevier Interactive Patient Education  [employer].

## 2017-10-03 LAB — GC/CHLAMYDIA PROBE AMP (~~LOC~~) NOT AT ARMC
CHLAMYDIA, DNA PROBE: NEGATIVE
Neisseria Gonorrhea: NEGATIVE

## 2017-10-05 LAB — CULTURE, BETA STREP (GROUP B ONLY): STREP GP B CULTURE: POSITIVE — AB

## 2017-10-06 ENCOUNTER — Encounter: Payer: Self-pay | Admitting: Obstetrics & Gynecology

## 2017-10-06 DIAGNOSIS — O99820 Streptococcus B carrier state complicating pregnancy: Secondary | ICD-10-CM | POA: Insufficient documentation

## 2017-10-10 ENCOUNTER — Ambulatory Visit (INDEPENDENT_AMBULATORY_CARE_PROVIDER_SITE_OTHER): Payer: Medicaid Other | Admitting: Family Medicine

## 2017-10-10 VITALS — BP 125/78 | HR 96 | Wt 223.0 lb

## 2017-10-10 DIAGNOSIS — O99820 Streptococcus B carrier state complicating pregnancy: Secondary | ICD-10-CM

## 2017-10-10 DIAGNOSIS — Z3480 Encounter for supervision of other normal pregnancy, unspecified trimester: Secondary | ICD-10-CM

## 2017-10-10 DIAGNOSIS — Z3483 Encounter for supervision of other normal pregnancy, third trimester: Secondary | ICD-10-CM

## 2017-10-10 NOTE — Patient Instructions (Signed)

## 2017-10-10 NOTE — Progress Notes (Signed)
   PRENATAL VISIT NOTE  Subjective:  Penny Klein is a 27 y.o. G3P2002 at [redacted]w[redacted]d being seen today for ongoing prenatal care.  She is currently monitored for the following issues for this low-risk pregnancy and has Abnormal weight gain; Class 1 obesity due to excess calories without serious comorbidity with body mass index (BMI) of 33.0 to 33.9 in adult; Supervision of other normal pregnancy, antepartum; Vitamin D deficiency; History of cervical LEEP biopsy affecting care of mother, antepartum; and Group B Streptococcus carrier, antepartum on their problem list.  Patient reports no complaints.  Contractions: Irregular. Vag. Bleeding: None.  Movement: Present. Denies leaking of fluid.   The following portions of the patient's history were reviewed and updated as appropriate: allergies, current medications, past family history, past medical history, past social history, past surgical history and problem list. Problem list updated.  Objective:   Vitals:   10/10/17 0810  BP: 125/78  Pulse: 96  Weight: 223 lb (101.2 kg)    Fetal Status:   Fundal Height: 37 cm Movement: Present  Presentation: Vertex  General:  Alert, oriented and cooperative. Patient is in no acute distress.  Skin: Skin is warm and dry. No rash noted.   Cardiovascular: Normal heart rate noted  Respiratory: Normal respiratory effort, no problems with respiration noted  Abdomen: Soft, gravid, appropriate for gestational age.  Pain/Pressure: Present     Pelvic: Cervical exam performed Dilation: 1 Effacement (%): 70 Station: -3  Extremities: Normal range of motion.  Edema: Trace  Mental Status: Normal mood and affect. Normal behavior. Normal judgment and thought content.   Assessment and Plan:  Pregnancy: G3P2002 at [redacted]w[redacted]d  1. Group B Streptococcus carrier, antepartum Will need treatment in labor  2. Supervision of other normal pregnancy, antepartum Continue routine prenatal care.   Term labor symptoms and general  obstetric precautions including but not limited to vaginal bleeding, contractions, leaking of fluid and fetal movement were reviewed in detail with the patient. Please refer to After Visit Summary for other counseling recommendations.  Return in 1 week (on 10/17/2017).  Future Appointments  Date Time Provider Alex  10/16/2017  9:30 AM Truett Mainland, DO [facility] None  10/23/2017  8:15 AM Truett Mainland, DO [facility] None  10/30/2017  8:15 AM Lavonia Drafts, MD [facility] None    Donnamae Jude, MD

## 2017-10-10 NOTE — Progress Notes (Signed)
Patient complaining of increasing back pain and pelvic pain. [name] RN

## 2017-10-16 ENCOUNTER — Ambulatory Visit (INDEPENDENT_AMBULATORY_CARE_PROVIDER_SITE_OTHER): Payer: Medicaid Other | Admitting: Family Medicine

## 2017-10-16 VITALS — BP 109/70 | HR 87 | Wt 225.0 lb

## 2017-10-16 DIAGNOSIS — Z3480 Encounter for supervision of other normal pregnancy, unspecified trimester: Secondary | ICD-10-CM

## 2017-10-16 DIAGNOSIS — O3440 Maternal care for other abnormalities of cervix, unspecified trimester: Secondary | ICD-10-CM

## 2017-10-16 DIAGNOSIS — E6609 Other obesity due to excess calories: Secondary | ICD-10-CM

## 2017-10-16 DIAGNOSIS — Z6833 Body mass index (BMI) 33.0-33.9, adult: Secondary | ICD-10-CM

## 2017-10-16 DIAGNOSIS — Z9889 Other specified postprocedural states: Secondary | ICD-10-CM

## 2017-10-16 DIAGNOSIS — O99820 Streptococcus B carrier state complicating pregnancy: Secondary | ICD-10-CM

## 2017-10-16 NOTE — Progress Notes (Signed)
   PRENATAL VISIT NOTE  Subjective:  Penny Klein is a 27 y.o. G3P2002 at [redacted]w[redacted]d being seen today for ongoing prenatal care.  She is currently monitored for the following issues for this low-risk pregnancy and has Abnormal weight gain; Class 1 obesity due to excess calories without serious comorbidity with body mass index (BMI) of 33.0 to 33.9 in adult; Supervision of other normal pregnancy, antepartum; Vitamin D deficiency; History of cervical LEEP biopsy affecting care of mother, antepartum; and Group B Streptococcus carrier, antepartum on their problem list.  Patient reports no complaints.  Contractions: Irritability. Vag. Bleeding: None.  Movement: Present. Denies leaking of fluid.   The following portions of the patient's history were reviewed and updated as appropriate: allergies, current medications, past family history, past medical history, past social history, past surgical history and problem list. Problem list updated.  Objective:   Vitals:   10/16/17 0945  BP: 109/70  Pulse: 87  Weight: 225 lb (102.1 kg)    Fetal Status: Fetal Heart Rate (bpm): 144   Movement: Present     General:  Alert, oriented and cooperative. Patient is in no acute distress.  Skin: Skin is warm and dry. No rash noted.   Cardiovascular: Normal heart rate noted  Respiratory: Normal respiratory effort, no problems with respiration noted  Abdomen: Soft, gravid, appropriate for gestational age.  Pain/Pressure: Present     Pelvic: Cervical exam deferred        Extremities: Normal range of motion.  Edema: None  Mental Status: Normal mood and affect. Normal behavior. Normal judgment and thought content.   Assessment and Plan:  Pregnancy: G3P2002 at [redacted]w[redacted]d  1. Supervision of other normal pregnancy, antepartum FHT and FH noraml  2. Group B Streptococcus carrier, antepartum Intrapartum prophylaxis  3. Class 1 obesity due to excess calories without serious comorbidity with body mass index (BMI) of 33.0 to  33.9 in adult  4. History of cervical LEEP biopsy affecting care of mother, antepartum   Term labor symptoms and general obstetric precautions including but not limited to vaginal bleeding, contractions, leaking of fluid and fetal movement were reviewed in detail with the patient. Please refer to After Visit Summary for other counseling recommendations.  No follow-ups on file.  Future Appointments  Date Time Provider [place]  10/23/2017  8:15 AM Truett Mainland, DO [facility] None  10/30/2017  8:15 AM Lavonia Drafts, MD [facility] None    Truett Mainland, DO

## 2017-10-21 ENCOUNTER — Telehealth: Payer: Self-pay

## 2017-10-21 NOTE — Telephone Encounter (Signed)
Pt called the office to change appt time for 10/23/17. Pt informed that appt time can not be changed for Thursday because all slots are filled. Understanding was voiced.

## 2017-10-23 ENCOUNTER — Ambulatory Visit (INDEPENDENT_AMBULATORY_CARE_PROVIDER_SITE_OTHER): Payer: Medicaid Other | Admitting: Family Medicine

## 2017-10-23 ENCOUNTER — Telehealth (HOSPITAL_COMMUNITY): Payer: Self-pay | Admitting: *Deleted

## 2017-10-23 VITALS — BP 110/70 | HR 90

## 2017-10-23 DIAGNOSIS — Z6833 Body mass index (BMI) 33.0-33.9, adult: Secondary | ICD-10-CM

## 2017-10-23 DIAGNOSIS — O99820 Streptococcus B carrier state complicating pregnancy: Secondary | ICD-10-CM

## 2017-10-23 DIAGNOSIS — Z3480 Encounter for supervision of other normal pregnancy, unspecified trimester: Secondary | ICD-10-CM

## 2017-10-23 DIAGNOSIS — E6609 Other obesity due to excess calories: Secondary | ICD-10-CM

## 2017-10-23 NOTE — Progress Notes (Signed)
Patient requesting a note to say she has been on bedrest since Aug. 2 2019 for work. Made patient aware that we have not  Requested that she be on bedrest.  Patient complaining of nausea in the evening and then having mild contractions in the evenings as well. [name] RN

## 2017-10-23 NOTE — Telephone Encounter (Signed)
Preadmission screen  

## 2017-10-23 NOTE — Progress Notes (Signed)
   PRENATAL VISIT NOTE  Subjective:  Penny Klein is a 27 y.o. G3P2002 at [redacted]w[redacted]d being seen today for ongoing prenatal care.  She is currently monitored for the following issues for this low-risk pregnancy and has Abnormal weight gain; Class 1 obesity due to excess calories without serious comorbidity with body mass index (BMI) of 33.0 to 33.9 in adult; Supervision of other normal pregnancy, antepartum; Vitamin D deficiency; History of cervical LEEP biopsy affecting care of mother, antepartum; and Group B Streptococcus carrier, antepartum on their problem list.  Patient reports occasional contractions.  Contractions: Irregular. Vag. Bleeding: None.  Movement: Present. Denies leaking of fluid.   The following portions of the patient's history were reviewed and updated as appropriate: allergies, current medications, past family history, past medical history, past social history, past surgical history and problem list. Problem list updated.  Objective:   Vitals:   10/23/17 0828  BP: 110/70  Pulse: 90    Fetal Status: Fetal Heart Rate (bpm): 154 Fundal Height: 39 cm Movement: Present  Presentation: Vertex  General:  Alert, oriented and cooperative. Patient is in no acute distress.  Skin: Skin is warm and dry. No rash noted.   Cardiovascular: Normal heart rate noted  Respiratory: Normal respiratory effort, no problems with respiration noted  Abdomen: Soft, gravid, appropriate for gestational age.  Pain/Pressure: Present     Pelvic: Cervical exam performed Dilation: 2 Effacement (%): 70 Station: -3  Extremities: Normal range of motion.  Edema: Trace  Mental Status: Normal mood and affect. Normal behavior. Normal judgment and thought content.   Assessment and Plan:  Pregnancy: G3P2002 at [redacted]w[redacted]d  1. Supervision of other normal pregnancy, antepartum FHT and FH normal. Induction scheduled for 40 weeks. MAU for foley balloon day prior (no office appointments available)  2. Group B  Streptococcus carrier, antepartum Intrapartum prophylaxis  3. Class 1 obesity due to excess calories without serious comorbidity with body mass index (BMI) of 33.0 to 33.9 in adult   Term labor symptoms and general obstetric precautions including but not limited to vaginal bleeding, contractions, leaking of fluid and fetal movement were reviewed in detail with the patient. Please refer to After Visit Summary for other counseling recommendations.  No follow-ups on file.  Future Appointments  Date Time Provider [place]  10/30/2017  7:30 AM [facility] None  11/20/2017  8:15 AM Truett Mainland, DO [facility] None    Truett Mainland, DO

## 2017-10-30 ENCOUNTER — Inpatient Hospital Stay (HOSPITAL_COMMUNITY)
Admission: RE | Admit: 2017-10-30 | Discharge: 2017-11-01 | DRG: 807 | Disposition: A | Discharge status: Home / Self Care | Payer: Medicaid Other | Attending: Obstetrics and Gynecology | Admitting: Obstetrics and Gynecology

## 2017-10-30 ENCOUNTER — Inpatient Hospital Stay (HOSPITAL_COMMUNITY): Payer: Medicaid Other | Admitting: Anesthesiology

## 2017-10-30 ENCOUNTER — Encounter: Payer: Medicaid Other | Admitting: Obstetrics & Gynecology

## 2017-10-30 DIAGNOSIS — Z3490 Encounter for supervision of normal pregnancy, unspecified, unspecified trimester: Secondary | ICD-10-CM

## 2017-10-30 DIAGNOSIS — O3440 Maternal care for other abnormalities of cervix, unspecified trimester: Secondary | ICD-10-CM

## 2017-10-30 DIAGNOSIS — Z9889 Other specified postprocedural states: Secondary | ICD-10-CM | POA: Diagnosis present

## 2017-10-30 DIAGNOSIS — O99824 Streptococcus B carrier state complicating childbirth: Principal | ICD-10-CM | POA: Diagnosis present

## 2017-10-30 DIAGNOSIS — Z3A40 40 weeks gestation of pregnancy: Secondary | ICD-10-CM | POA: Diagnosis not present

## 2017-10-30 DIAGNOSIS — Z3483 Encounter for supervision of other normal pregnancy, third trimester: Secondary | ICD-10-CM | POA: Diagnosis present

## 2017-10-30 DIAGNOSIS — Z3480 Encounter for supervision of other normal pregnancy, unspecified trimester: Secondary | ICD-10-CM

## 2017-10-30 DIAGNOSIS — E559 Vitamin D deficiency, unspecified: Secondary | ICD-10-CM | POA: Diagnosis present

## 2017-10-30 DIAGNOSIS — O99820 Streptococcus B carrier state complicating pregnancy: Secondary | ICD-10-CM

## 2017-10-30 LAB — CBC
HEMATOCRIT: 37.2 % (ref 36.0–46.0)
HEMOGLOBIN: 12.7 g/dL (ref 12.0–15.0)
MCH: 30.2 pg (ref 26.0–34.0)
MCHC: 34.1 g/dL (ref 30.0–36.0)
MCV: 88.6 fL (ref 78.0–100.0)
Platelets: 241 10*3/uL (ref 150–400)
RBC: 4.2 MIL/uL (ref 3.87–5.11)
RDW: 14.6 % (ref 11.5–15.5)
WBC: 10.4 10*3/uL (ref 4.0–10.5)

## 2017-10-30 LAB — TYPE AND SCREEN
ABO/RH(D): O POS
ANTIBODY SCREEN: NEGATIVE

## 2017-10-30 LAB — ABO/RH: ABO/RH(D): O POS

## 2017-10-30 MED ORDER — LACTATED RINGERS IV SOLN
INTRAVENOUS | Status: DC
  Administered 2017-10-31: via INTRAVENOUS

## 2017-10-30 MED ORDER — ACETAMINOPHEN 325 MG PO TABS
650.0000 mg | ORAL_TABLET | ORAL | Status: DC | PRN
  Administered 2017-10-31 (×2): 650 mg via ORAL
  Filled 2017-10-30 (×2): qty 2

## 2017-10-30 MED ORDER — OXYTOCIN 40 UNITS IN LACTATED RINGERS INFUSION - SIMPLE MED: 2.5000 [IU]/h | INTRAVENOUS | Status: DC

## 2017-10-30 MED ORDER — PRENATAL MULTIVITAMIN CH
1.0000 | ORAL_TABLET | Freq: Every day | ORAL | Status: DC
  Administered 2017-11-01: 1 via ORAL
  Filled 2017-10-30 (×2): qty 1

## 2017-10-30 MED ORDER — FAMOTIDINE 20 MG PO TABS
40.0000 mg | ORAL_TABLET | Freq: Once | ORAL | Status: AC
  Administered 2017-10-31: 40 mg via ORAL
  Filled 2017-10-30: qty 2

## 2017-10-30 MED ORDER — ONDANSETRON HCL 4 MG/2ML IJ SOLN: 4.0000 mg | Freq: Four times a day (QID) | INTRAMUSCULAR | Status: DC | PRN

## 2017-10-30 MED ORDER — TETANUS-DIPHTH-ACELL PERTUSSIS 5-2.5-18.5 LF-MCG/0.5 IM SUSP: 0.5000 mL | Freq: Once | INTRAMUSCULAR | Status: DC

## 2017-10-30 MED ORDER — OXYCODONE-ACETAMINOPHEN 5-325 MG PO TABS: 1.0000 | ORAL_TABLET | ORAL | Status: DC | PRN

## 2017-10-30 MED ORDER — SODIUM CHLORIDE 0.9 % IV SOLN
5.0000 10*6.[IU] | Freq: Once | INTRAVENOUS | Status: AC
  Administered 2017-10-30: 5 10*6.[IU] via INTRAVENOUS
  Filled 2017-10-30: qty 5

## 2017-10-30 MED ORDER — PENICILLIN G 3 MILLION UNITS IVPB - SIMPLE MED
3.0000 10*6.[IU] | INTRAVENOUS | Status: DC
  Administered 2017-10-30 (×2): 3 10*6.[IU] via INTRAVENOUS
  Filled 2017-10-30: qty 100
  Filled 2017-10-30 (×2): qty 3
  Filled 2017-10-30: qty 100

## 2017-10-30 MED ORDER — LIDOCAINE HCL (PF) 1 % IJ SOLN
INTRAMUSCULAR | Status: DC | PRN
  Administered 2017-10-30 (×2): 5 mL via EPIDURAL

## 2017-10-30 MED ORDER — BENZOCAINE-MENTHOL 20-0.5 % EX AERO
1.0000 "application " | INHALATION_SPRAY | CUTANEOUS | Status: DC | PRN
  Administered 2017-10-30: 1 via TOPICAL
  Filled 2017-10-30: qty 56

## 2017-10-30 MED ORDER — SIMETHICONE 80 MG PO CHEW: 80.0000 mg | CHEWABLE_TABLET | ORAL | Status: DC | PRN

## 2017-10-30 MED ORDER — DIPHENHYDRAMINE HCL 50 MG/ML IJ SOLN: 12.5000 mg | INTRAMUSCULAR | Status: DC | PRN

## 2017-10-30 MED ORDER — EPHEDRINE 5 MG/ML INJ
10.0000 mg | INTRAVENOUS | Status: DC | PRN
  Filled 2017-10-30: qty 2

## 2017-10-30 MED ORDER — ACETAMINOPHEN 325 MG PO TABS: 650.0000 mg | ORAL_TABLET | ORAL | Status: DC | PRN

## 2017-10-30 MED ORDER — COCONUT OIL OIL: 1.0000 "application " | TOPICAL_OIL | Status: DC | PRN

## 2017-10-30 MED ORDER — LACTATED RINGERS IV SOLN
INTRAVENOUS | Status: DC
  Administered 2017-10-30 (×2): via INTRAVENOUS

## 2017-10-30 MED ORDER — WITCH HAZEL-GLYCERIN EX PADS: 1.0000 "application " | MEDICATED_PAD | CUTANEOUS | Status: DC | PRN

## 2017-10-30 MED ORDER — PHENYLEPHRINE 40 MCG/ML (10ML) SYRINGE FOR IV PUSH (FOR BLOOD PRESSURE SUPPORT)
80.0000 ug | PREFILLED_SYRINGE | INTRAVENOUS | Status: DC | PRN
  Filled 2017-10-30: qty 5
  Filled 2017-10-30: qty 10

## 2017-10-30 MED ORDER — LACTATED RINGERS IV SOLN: INTRAVENOUS | Status: DC

## 2017-10-30 MED ORDER — SOD CITRATE-CITRIC ACID 500-334 MG/5ML PO SOLN: 30.0000 mL | ORAL | Status: DC | PRN

## 2017-10-30 MED ORDER — ONDANSETRON HCL 4 MG/2ML IJ SOLN: 4.0000 mg | INTRAMUSCULAR | Status: DC | PRN

## 2017-10-30 MED ORDER — OXYTOCIN BOLUS FROM INFUSION
500.0000 mL | Freq: Once | INTRAVENOUS | Status: AC
  Administered 2017-10-30: 500 mL via INTRAVENOUS

## 2017-10-30 MED ORDER — OXYTOCIN 40 UNITS IN LACTATED RINGERS INFUSION - SIMPLE MED
1.0000 m[IU]/min | INTRAVENOUS | Status: DC
  Administered 2017-10-30: 14 m[IU]/min via INTRAVENOUS
  Administered 2017-10-30: 2 m[IU]/min via INTRAVENOUS
  Filled 2017-10-30: qty 1000

## 2017-10-30 MED ORDER — TERBUTALINE SULFATE 1 MG/ML IJ SOLN
0.2500 mg | Freq: Once | INTRAMUSCULAR | Status: DC | PRN
  Filled 2017-10-30: qty 1

## 2017-10-30 MED ORDER — SENNOSIDES-DOCUSATE SODIUM 8.6-50 MG PO TABS
2.0000 | ORAL_TABLET | ORAL | Status: DC
  Administered 2017-10-31 (×2): 2 via ORAL
  Filled 2017-10-30 (×2): qty 2

## 2017-10-30 MED ORDER — DIBUCAINE 1 % RE OINT: 1.0000 "application " | TOPICAL_OINTMENT | RECTAL | Status: DC | PRN

## 2017-10-30 MED ORDER — LACTATED RINGERS IV SOLN: 500.0000 mL | INTRAVENOUS | Status: DC | PRN

## 2017-10-30 MED ORDER — PHENYLEPHRINE 40 MCG/ML (10ML) SYRINGE FOR IV PUSH (FOR BLOOD PRESSURE SUPPORT)
80.0000 ug | PREFILLED_SYRINGE | INTRAVENOUS | Status: DC | PRN
  Administered 2017-10-30: 80 ug via INTRAVENOUS
  Filled 2017-10-30: qty 5

## 2017-10-30 MED ORDER — IBUPROFEN 600 MG PO TABS
600.0000 mg | ORAL_TABLET | Freq: Four times a day (QID) | ORAL | Status: DC
  Administered 2017-10-30 – 2017-11-01 (×6): 600 mg via ORAL
  Filled 2017-10-30 (×8): qty 1

## 2017-10-30 MED ORDER — LIDOCAINE HCL (PF) 1 % IJ SOLN
30.0000 mL | INTRAMUSCULAR | Status: DC | PRN
  Administered 2017-10-30: 30 mL via SUBCUTANEOUS
  Filled 2017-10-30: qty 30

## 2017-10-30 MED ORDER — METOCLOPRAMIDE HCL 10 MG PO TABS
10.0000 mg | ORAL_TABLET | Freq: Once | ORAL | Status: AC
  Administered 2017-10-31: 10 mg via ORAL
  Filled 2017-10-30: qty 1

## 2017-10-30 MED ORDER — OXYCODONE-ACETAMINOPHEN 5-325 MG PO TABS: 2.0000 | ORAL_TABLET | ORAL | Status: DC | PRN

## 2017-10-30 MED ORDER — LACTATED RINGERS IV SOLN
500.0000 mL | Freq: Once | INTRAVENOUS | Status: AC
  Administered 2017-10-30: 500 mL via INTRAVENOUS

## 2017-10-30 MED ORDER — ONDANSETRON HCL 4 MG PO TABS: 4.0000 mg | ORAL_TABLET | ORAL | Status: DC | PRN

## 2017-10-30 MED ORDER — FENTANYL 2.5 MCG/ML BUPIVACAINE 1/10 % EPIDURAL INFUSION (WH - ANES)
14.0000 mL/h | INTRAMUSCULAR | Status: DC | PRN
  Administered 2017-10-30: 14 mL/h via EPIDURAL
  Filled 2017-10-30: qty 100

## 2017-10-30 MED ORDER — DIPHENHYDRAMINE HCL 25 MG PO CAPS: 25.0000 mg | ORAL_CAPSULE | Freq: Four times a day (QID) | ORAL | Status: DC | PRN

## 2017-10-30 NOTE — Anesthesia Procedure Notes (Signed)
Epidural Patient location during procedure: OB  Staffing Anesthesiologist: Tran Arzuaga, MD Performed: anesthesiologist   Preanesthetic Checklist Completed: patient identified, site marked, surgical consent, pre-op evaluation, timeout performed, IV checked, risks and benefits discussed and monitors and equipment checked  Epidural Patient position: sitting Prep: DuraPrep Patient monitoring: heart rate, continuous pulse ox and blood pressure Approach: right paramedian Location: L3-L4 Injection technique: LOR saline  Needle:  Needle type: Tuohy  Needle gauge: 17 G Needle length: 9 cm and 9 Needle insertion depth: 7 cm Catheter type: closed end flexible Catheter size: 20 Guage Catheter at skin depth: 11 cm Test dose: negative  Assessment Events: blood not aspirated, injection not painful, no injection resistance, negative IV test and no paresthesia  Additional Notes Patient identified. Risks/Benefits/Options discussed with patient including but not limited to bleeding, infection, nerve damage, paralysis, failed block, incomplete pain control, headache, blood pressure changes, nausea, vomiting, reactions to medication both or allergic, itching and postpartum back pain. Confirmed with bedside nurse the patient's most recent platelet count. Confirmed with patient that they are not currently taking any anticoagulation, have any bleeding history or any family history of bleeding disorders. Patient expressed understanding and wished to proceed. All questions were answered. Sterile technique was used throughout the entire procedure. Please see nursing notes for vital signs. Test dose was given through epidural needle and negative prior to continuing to dose epidural or start infusion. Warning signs of high block given to the patient including shortness of breath, tingling/numbness in hands, complete motor block, or any concerning symptoms with instructions to call for help. Patient was given  instructions on fall risk and not to get out of bed. All questions and concerns addressed with instructions to call with any issues.     

## 2017-10-30 NOTE — H&P (Signed)
Penny Klein is a 27 y.o. female presenting for elective IOL at term. Patient receives [facility]. Dating is by LMP.  OB History    Gravida  3   Para  2   Term  2   Preterm      AB      Living  2     SAB      TAB      Ectopic      Multiple      Live Births  2          Past Medical History:  Diagnosis Date  . Abnormal Pap smear of cervix 2011  . Dysmenorrhea   . Leiomyoma of uterus   . Menometrorrhagia    Past Surgical History:  Procedure Laterality Date  . LEEP     Family History: family history includes Anxiety disorder in her father; Breast cancer in her maternal aunt, maternal aunt, and maternal grandmother; Hyperlipidemia in her father and mother; Hypertension in her father and mother; Lupus in her paternal uncle; Obesity in her mother; Stomach cancer in her paternal grandmother. Social History:  reports that she has never smoked. She has never used smokeless tobacco. She reports that she does not drink alcohol or use drugs.     Maternal Diabetes: No Genetic Screening: Normal Maternal Ultrasounds/Referrals: Normal Fetal Ultrasounds or other Referrals:  None Maternal Substance Abuse:  No Significant Maternal Medications:  None Significant Maternal Lab Results:  None Other Comments:  GBS POS (PCN)  ROS History Dilation: 2.5 Effacement (%): 80 Station: -2 Exam by:: [employer]. CNM Blood pressure 124/75, pulse 93, temperature 99.2 F (37.3 C), temperature source Oral, resp. rate 17, height 5\' 5"  (1.651 m), weight 103.2 kg, last menstrual period 01/23/2017. Exam Physical Exam  Prenatal labs: ABO, Rh: O/Positive/-- (01/04 0955) Antibody: Negative (01/04 0955) Rubella: 12.90 (01/04 0955) RPR: Non Reactive (05/22 0855)  HBsAg: Negative (01/04 0955)  HIV: Non Reactive (05/22 0855)  GBS: Positive (07/25 0000)   Assessment/Plan: --27 y.o. G3P2002 at [redacted]w[redacted]d , elective IOL at term --GBS POS, PCN --Management delayed by significant difficulty  obtaining IV access, labs --Category I fetal monitoring: baseline 140, moderate variability, positive accelerations, no decelerations --Toco: irregular mild contractions q 3-43min --planning epidural   Boy/breast/BTL (papers signed 08/08/2017)    Darlina Rumpf, CNM 10/30/2017, 10:40 AM

## 2017-10-30 NOTE — Progress Notes (Signed)
Penny Klein is a 27 y.o. G3P2002 at [redacted]w[redacted]d by admitted for elective IOL at term.  Subjective: Denies pain, complaints or concerns.  Objective: BP (!) 90/55   Pulse (!) 110   Temp 98.2 F (36.8 C) (Oral)   Resp 17   Ht 5\' 5"  (1.651 m)   Wt 103.2 kg   LMP 01/23/2017   SpO2 100%   BMI 37.87 kg/m  No intake/output data recorded. No intake/output data recorded.  FHT:  FHR: 140 bpm, variability: moderate,  accelerations:  Present,  decelerations:  Absent UC:   irregular, every 2-4 minutes SVE:   Dilation: 7 Effacement (%): 80, 90 Station: -2, -1 Exam by:: Rosine Abe CNM  Labs: Lab Results  Component Value Date   WBC 10.4 10/30/2017   HGB 12.7 10/30/2017   HCT 37.2 10/30/2017   MCV 88.6 10/30/2017   PLT 241 10/30/2017    Assessment / Plan: Induction of labor due to elective IOL,  progressing well on pitocin  AROM'd now  Labor: Progressing normally Preeclampsia:  N/A Fetal Wellbeing:  Category I Pain Control:  Epidural I/D:  n/a Anticipated MOD:  NSVD  Darlina Rumpf, CNM 10/30/2017, 4:24 PM

## 2017-10-30 NOTE — Anesthesia Pain Management Evaluation Note (Signed)
  CRNA Pain Management Visit Note  Patient: Penny Klein, 27 y.o., female  "Hello I am a member of the anesthesia team at [facility]. We have an anesthesia team available at all times to provide care throughout the hospital, including epidural management and anesthesia for C-section. I don't know your plan for the delivery whether it a natural birth, water birth, IV sedation, nitrous supplementation, doula or epidural, but we want to meet your pain goals."   1.Was your pain managed to your expectations on prior hospitalizations?   Yes   2.What is your expectation for pain management during this hospitalization?     Epidural  3.How can we help you reach that goal? Epidural at pain goal.  Record the patient's initial score and the patient's pain goal.   Pain: 5  Pain Goal: 7 [facility] wants you to be able to say your pain was always managed very well.  Charie Pinkus 10/30/2017

## 2017-10-30 NOTE — Anesthesia Preprocedure Evaluation (Signed)
Anesthesia Evaluation  Patient identified by MRN, date of birth, ID band Patient awake    Reviewed: Allergy & Precautions, NPO status , Patient's Chart, lab work & pertinent test results  Airway Mallampati: II  TM Distance: >3 FB Neck ROM: Full    Dental no notable dental hx.    Pulmonary neg pulmonary ROS,    Pulmonary exam normal breath sounds clear to auscultation       Cardiovascular negative cardio ROS Normal cardiovascular exam Rhythm:Regular Rate:Normal     Neuro/Psych negative neurological ROS  negative psych ROS   GI/Hepatic negative GI ROS, Neg liver ROS,   Endo/Other  negative endocrine ROS  Renal/GU negative Renal ROS  negative genitourinary   Musculoskeletal negative musculoskeletal ROS (+)   Abdominal   Peds negative pediatric ROS (+)  Hematology negative hematology ROS (+)   Anesthesia Other Findings   Reproductive/Obstetrics (+) Pregnancy                             Anesthesia Physical Anesthesia Plan  ASA: II  Anesthesia Plan: Epidural   Post-op Pain Management:    Induction:   PONV Risk Score and Plan: Treatment may vary due to age or medical condition  Airway Management Planned:   Additional Equipment:   Intra-op Plan:   Post-operative Plan:   Informed Consent: I have reviewed the patients History and Physical, chart, labs and discussed the procedure including the risks, benefits and alternatives for the proposed anesthesia with the patient or authorized representative who has indicated his/her understanding and acceptance.     Plan Discussed with:   Anesthesia Plan Comments:         Anesthesia Quick Evaluation

## 2017-10-31 ENCOUNTER — Other Ambulatory Visit: Payer: Self-pay

## 2017-10-31 ENCOUNTER — Encounter (HOSPITAL_COMMUNITY)
Admission: RE | Disposition: A | Discharge status: Home / Self Care | Payer: Self-pay | Source: Home / Self Care | Attending: Obstetrics and Gynecology

## 2017-10-31 ENCOUNTER — Encounter (HOSPITAL_COMMUNITY): Payer: Self-pay

## 2017-10-31 ENCOUNTER — Inpatient Hospital Stay (HOSPITAL_COMMUNITY): Payer: Medicaid Other | Admitting: Certified Registered Nurse Anesthetist

## 2017-10-31 LAB — CBC
HEMATOCRIT: 33.7 % — AB (ref 36.0–46.0)
HEMOGLOBIN: 11.1 g/dL — AB (ref 12.0–15.0)
MCH: 28.9 pg (ref 26.0–34.0)
MCHC: 32.9 g/dL (ref 30.0–36.0)
MCV: 87.8 fL (ref 78.0–100.0)
Platelets: 215 10*3/uL (ref 150–400)
RBC: 3.84 MIL/uL — AB (ref 3.87–5.11)
RDW: 14.7 % (ref 11.5–15.5)
WBC: 12.8 10*3/uL — ABNORMAL HIGH (ref 4.0–10.5)

## 2017-10-31 LAB — RPR: RPR: NONREACTIVE

## 2017-10-31 SURGERY — LIGATION, FALLOPIAN TUBE, POSTPARTUM
Anesthesia: Choice

## 2017-10-31 MED ORDER — LIDOCAINE-EPINEPHRINE (PF) 2 %-1:200000 IJ SOLN
INTRAMUSCULAR | Status: AC
  Filled 2017-10-31: qty 20

## 2017-10-31 MED ORDER — SODIUM BICARBONATE 8.4 % IV SOLN
INTRAVENOUS | Status: AC
  Filled 2017-10-31: qty 50

## 2017-10-31 NOTE — Anesthesia Postprocedure Evaluation (Signed)
Anesthesia Post Note  Patient: Penny Klein  Procedure(s) Performed: AN AD [place]     Patient location during evaluation: Mother Baby Anesthesia Type: Epidural Level of consciousness: awake Pain management: pain level controlled Vital Signs Assessment: post-procedure vital signs reviewed and stable Respiratory status: spontaneous breathing Cardiovascular status: stable Postop Assessment: no headache, no backache, epidural receding, patient able to bend at knees, no apparent nausea or vomiting, adequate PO intake and able to ambulate Anesthetic complications: no    Last Vitals:  Vitals:   10/30/17 2100 10/31/17 0030  BP: 132/67 124/65  Pulse: 100 86  Resp: 18 18  Temp: 37.2 C 37.1 C  SpO2: 99% 100%    Last Pain:  Vitals:   10/31/17 0530  TempSrc:   PainSc: 5    Pain Goal:                 Bartlett Enke

## 2017-10-31 NOTE — Plan of Care (Signed)
Pt. Condition will continue to improve 

## 2017-10-31 NOTE — Lactation Note (Signed)
This note was copied from a baby's chart. Lactation Consultation Note  Patient Name: Penny Klein XIPJA'S Date: 10/31/2017 Reason for consult: Initial assessment;Term  P3 mother whose infant is now 32 hours old.  Mother has breastfeeding experience.  Mother was going to go for a tubal today but has postponed this since her OR time has been interrupted.  She plans to get this done at a later date.  Mother had recently given a bottle but baby was beginning to awaken so I offered to assist and mother accepted.  Mother wanted to try the cross cradle position.  Assisted baby to latch onto the left breast without difficulty.  Mother immediately moved her arms into the cradle position and I politely suggested the cross cradle with a good explanation as to why this is a good hold for newborns.  Mother obliged and was very receptive to learning.  Encouraged to feed 8-12 times/24 hours or sooner if he shows feeding cues.  Reviewed cues.  Taught breast compressions and hand expression.  Colostrum container provided for any EBM mother obtains with hand expression    Mom made aware of O/P services, breastfeeding support groups, community resources, and our phone # for post-discharge questions. Mother asked if she could call for my assistance with the next feed and I reassured her that she can call her RN/LC at any time she needs assistance.  Father present and supportive. RN updated.  Maternal Data Formula Feeding for Exclusion: No Has patient been taught Hand Expression?: Yes Does the patient have breastfeeding experience prior to this delivery?: Yes  Feeding Feeding Type: Breast Fed Nipple Type: Slow - flow Length of feed: 10 min(still feeding when i left the room)  LATCH Score Latch: Grasps breast easily, tongue down, lips flanged, rhythmical sucking.  Audible Swallowing: None  Type of Nipple: Everted at rest and after stimulation  Comfort (Breast/Nipple): Soft / non-tender  Hold  (Positioning): Assistance needed to correctly position infant at breast and maintain latch.  LATCH Score: 7  Interventions Interventions: Breast feeding basics reviewed;Assisted with latch;Skin to skin;Breast massage;Hand express;Position options;Support pillows;Adjust position;Breast compression  Lactation Tools Discussed/Used WIC Program: Yes   Consult Status Consult Status: Follow-up Date: 11/01/17 Follow-up type: In-patient    Tannya Gonet R Serafino Burciaga 10/31/2017, 2:18 PM

## 2017-10-31 NOTE — Progress Notes (Signed)
POSTPARTUM PROGRESS NOTE  Post Partum Day 1  Subjective:  Penny Klein is a 27 y.o. [id number] s/p SVD at [redacted]w[redacted]d.  She reports she is doing well. No acute events overnight. She denies any problems with ambulating, voiding or po intake. Denies nausea or vomiting.  Pain is well controlled.  Lochia is appropriate.  Objective: Blood pressure 124/81, pulse 82, temperature 98.6 F (37 C), temperature source Oral, resp. rate 18, height 5\' 5"  (1.651 m), weight 103.2 kg, last menstrual period 01/23/2017, SpO2 99 %.  Physical Exam:  General: alert, cooperative and no distress Chest: no respiratory distress Heart:regular rate, distal pulses intact Abdomen: soft, nontender,  Uterine Fundus: firm, appropriately tender DVT Evaluation: No calf swelling or tenderness Skin: warm, dry  Recent Labs    10/30/17 0915 10/31/17 0624  HGB 12.7 11.1*  HCT 37.2 33.7*    Assessment/Plan: Penny Klein is a 27 y.o. [id number] s/p SVD at [redacted]w[redacted]d   PPD#1 - Doing well  Routine postpartum care Contraception: BTL today  Feeding: Breast Dispo: Plan for discharge tomorrow.   LOS: 1 day   Phill Myron, D.O. OB Fellow  10/31/2017, 1:04 PM

## 2017-10-31 NOTE — Progress Notes (Signed)
Pt states she has decided to do BTL out patient. Decided not to do it today. Dr Juleen China notified.

## 2017-11-01 MED ORDER — IBUPROFEN 600 MG PO TABS: 600.0000 mg | ORAL_TABLET | Freq: Four times a day (QID) | ORAL | 0 refills | Status: DC

## 2017-11-01 NOTE — Lactation Note (Signed)
This note was copied from a baby's chart. Lactation Consultation Note: Infant is now 55 hours old.  Mother has been mostly bottle feeding formula.  Staff nurse request [place] assistance with latching infant.  Mother reports that infant was in nursery yesterday due to her scheduled surgery.  Mother reports that surgery was not done and infant started getting bottles.  Mother reports that she really wants to breastfeed. She breastfed one other child for a month.  Infant has had 20-26 ml of formula every 3-4  Hours during the night.   Assist mother with latching infant on the RT breast in football hold.  Infant latched but was observed with non-nutritive suckling.  Infant sustained latch for 20 mins. Mother taught breast compression.  Lots of teaching at this time with mother on basics of breastfeeding.    LC sat up a #5 fr feeding tube and gave infant 10 min of formula.  Mother was also taught to use a curved tip syringe to finger feed.  Mother to breastfeed infant and supplement infant after feeding with ebm/formula.   Encouraged mother to post pump with her own electric pump when she arrives home. Advised to pump after each feeding for 15 mins until milk comes to volume.  Mother was given a harmony hand pump with instructions.   Advised mother in cue base feeding and feeding infant at least 8-12 times in 24 hours.  Discussed cluster feeding.  Discussed treatment and prevention of engorgement.   Mother is active with [place]. Mother is aware of available [place] services at [facility]. Encouraged mother to follow up to [facility] on Tuesday at [place].  Mother has [facility] office phone number for breastfeeding questions or concerns.    Patient Name: Boy Natalija Mavis MHWKG'S Date: 11/01/2017 Reason for consult: Follow-up assessment   Maternal Data    Feeding Feeding Type: Formula Length of feed: 10 min  LATCH Score Latch: Grasps breast easily, tongue down, lips flanged, rhythmical sucking.  Audible  Swallowing: Spontaneous and intermittent  Type of Nipple: Everted at rest and after stimulation  Comfort (Breast/Nipple): Soft / non-tender  Hold (Positioning): Assistance needed to correctly position infant at breast and maintain latch.  LATCH Score: 9  Interventions Interventions: Breast feeding basics reviewed;Assisted with latch;Skin to skin;Hand express;Breast compression;Adjust position;Support pillows;Position options;Expressed milk;Hand pump  Lactation Tools Discussed/Used Tools: 21F feeding tube / Syringe   Consult Status Consult Status: Complete    Darla Lesches 11/01/2017, 12:04 PM

## 2017-11-01 NOTE — Discharge Summary (Addendum)
OB Discharge Summary     Patient Name: Penny Klein DOB: 1990-05-01 MRN: [id number]  Date of admission: 10/30/2017 Delivering MD: Darlina Rumpf   Date of discharge: 11/01/2017  Admitting diagnosis: INDUCTION Intrauterine pregnancy: [redacted]w[redacted]d     Secondary diagnosis:  Active Problems:   Supervision of other normal pregnancy, antepartum   Vitamin D deficiency   History of cervical LEEP biopsy affecting care of mother, antepartum   Group B Streptococcus carrier, antepartum   Pregnancy   Vaginal delivery  Additional problems: GBS positive, received PCN     Discharge diagnosis: Term Pregnancy Delivered                                                                                                Post partum procedures:none  Augmentation: AROM and Pitocin  Complications: None  Hospital course:  Induction of Labor With Vaginal Delivery   27 y.o. yo G3P2002 at [redacted]w[redacted]d was admitted to the hospital 10/30/2017 for induction of labor.  Indication for induction: elective at term.  Patient had an uncomplicated labor course as follows: Membrane Rupture Time/Date: 4:16 PM ,10/30/2017   Intrapartum Procedures: Episiotomy: None [1]                                         Lacerations:  1st degree [2]  Patient had delivery of a Viable infant.  Information for the patient's newborn:  Elianah, Karis [[id number]]      10/30/2017  Details of delivery can be found in separate delivery note.  Patient had a routine postpartum course. Patient is discharged home 11/01/17.  Physical exam  Vitals:   10/31/17 0800 10/31/17 1501 10/31/17 2208 11/01/17 0627  BP: 124/81 108/63 118/73 118/72  Pulse: 82 79 96 85  Resp: 18 18 16 16   Temp: 98.6 F (37 C) 97.8 F (36.6 C) 98.3 F (36.8 C) 98.1 F (36.7 C)  TempSrc: Oral Oral Oral Oral  SpO2: 99% 100% 100% 100%  Weight:      Height:       General: alert, cooperative and no distress Lochia: appropriate Uterine Fundus: firm Incision: Healing  well with no significant drainage DVT Evaluation: No evidence of DVT seen on physical exam. Labs: Lab Results  Component Value Date   WBC 12.8 (H) 10/31/2017   HGB 11.1 (L) 10/31/2017   HCT 33.7 (L) 10/31/2017   MCV 87.8 10/31/2017   PLT 215 10/31/2017   CMP Latest Ref Rng & Units 01/02/2017  Glucose 65 - 99 mg/dL 85  BUN 6 - 20 mg/dL 9  Creatinine 0.44 - 1.00 mg/dL 0.75  Sodium 135 - 145 mmol/L 140  Potassium 3.5 - 5.1 mmol/L 3.7  Chloride 101 - 111 mmol/L 106  CO2 22 - 32 mmol/L 23  Calcium 8.9 - 10.3 mg/dL 10.0    Discharge instruction: per After Visit Summary and "Baby and Me Booklet".  After visit meds:  Allergies as of 11/01/2017   No Known Allergies     Medication List  STOP taking these medications   ranitidine 150 MG capsule Commonly known as:  ZANTAC     TAKE these medications   ibuprofen 600 MG tablet Commonly known as:  ADVIL,MOTRIN Take 1 tablet (600 mg total) by mouth every 6 (six) hours.   multivitamin-prenatal 27-0.8 MG Tabs tablet Take 1 tablet by mouth daily at 12 noon.   pantoprazole 40 MG tablet Commonly known as:  PROTONIX Take 1 tablet (40 mg total) by mouth daily.       Diet: routine diet  Activity: Advance as tolerated. Pelvic rest for 6 weeks.   Outpatient follow up:2 weeks- preop BTL Follow up Appt: Future Appointments  Date Time Provider [place]  11/20/2017  8:15 AM Truett Mainland, DO [facility] None   Follow up Visit:No follow-ups on file.  Postpartum contraception: interval BTL  Newborn Data: Live born female  Birth Weight: 7 lb 5.1 oz (3320 g) APGAR: 9, 9  Newborn Delivery   Birth date/time:  10/30/2017 18:16:00 Delivery type:  Vaginal, Spontaneous     Baby Feeding: Breast Disposition:home with mother   11/01/2017 Matilde Haymaker, MD  CNM attestation I have seen and examined this patient and agree with above documentation in the resident's note.   Prisca Gearing is a 27 y.o. G3P2002 s/p SVD.   Pain  is well controlled.  Plan for birth control is bilateral tubal ligation- interval.  Method of Feeding: breast  PE:  BP 118/72   Pulse 85   Temp 98.1 F (36.7 C) (Oral)   Resp 16   Ht 5\' 5"  (1.651 m)   Wt 103.2 kg   LMP 01/23/2017   SpO2 100%   BMI 37.87 kg/m  Fundus firm  No results for input(s): HGB, HCT in the last 72 hours.   Plan: discharge today - postpartum care discussed - f/u clinic in 2 weeks as a pre-op visit for Manning, KIMBERLY, CNM 3:01 PM

## 2017-11-20 ENCOUNTER — Encounter: Payer: Self-pay | Admitting: Family Medicine

## 2017-11-20 ENCOUNTER — Ambulatory Visit (INDEPENDENT_AMBULATORY_CARE_PROVIDER_SITE_OTHER): Payer: Medicaid Other | Admitting: Family Medicine

## 2017-11-20 DIAGNOSIS — Z1389 Encounter for screening for other disorder: Secondary | ICD-10-CM | POA: Diagnosis not present

## 2017-11-20 NOTE — Progress Notes (Signed)
Post Partum Exam  Penny Klein is a 27 y.o. G94P2002 female who presents for a postpartum visit. She is 3 weeks postpartum following a spontaneous vaginal delivery. I have fully reviewed the prenatal and intrapartum course. The delivery was at 40 gestational weeks.  Anesthesia: epidural. Postpartum course has been unremarkable. Baby's course has been unremarkable. Baby is feeding by bottle - [employer] Good Start. Bleeding staining only. Bowel function is normal. Bladder function is normal. Patient is not sexually active. Contraception method is undecided.. Postpartum depression screening:neg  The following portions of the patient's history were reviewed and updated as appropriate: allergies, current medications, past family history, past medical history, past social history, past surgical history and problem list. Last pap smear done 03/2015 and was Normal  Review of Systems Pertinent items noted in HPI and remainder of comprehensive ROS otherwise negative.    Objective:  Last menstrual period 01/23/2017.  General:  alert, cooperative and no distress  Lungs: clear to auscultation bilaterally  Heart:  regular rate and rhythm, S1, S2 normal, no murmur, click, rub or gallop  Abdomen: soft, non-tender; bowel sounds normal; no masses,  no organomegaly   Vulva:  normal        Assessment:    normal postpartum exam. Pap smear not done at today's visit.   Plan:   1. Contraception: uncertain 2. Discussed options - pt leaning towards IUD and nuvaring 3. Follow up in: 3 weeks or as needed.

## 2017-11-20 NOTE — Patient Instructions (Signed)
Levonorgestrel intrauterine device (IUD) What is this medicine? LEVONORGESTREL IUD (LEE voe nor jes trel) is a contraceptive (birth control) device. The device is placed inside the uterus by a healthcare professional. It is used to prevent pregnancy. This device can also be used to treat heavy bleeding that occurs during your period. This medicine may be used for other purposes; ask your health care provider or pharmacist if you have questions. COMMON BRAND NAME(S): Kyleena, LILETTA, Mirena, Skyla What should I tell my health care provider before I take this medicine? They need to know if you have any of these conditions: -abnormal Pap smear -cancer of the breast, uterus, or cervix -diabetes -endometritis -genital or pelvic infection now or in the past -have more than one sexual partner or your partner has more than one partner -heart disease -history of an ectopic or tubal pregnancy -immune system problems -IUD in place -liver disease or tumor -problems with blood clots or take blood-thinners -seizures -use intravenous drugs -uterus of unusual shape -vaginal bleeding that has not been explained -an unusual or allergic reaction to levonorgestrel, other hormones, silicone, or polyethylene, medicines, foods, dyes, or preservatives -pregnant or trying to get pregnant -breast-feeding How should I use this medicine? This device is placed inside the uterus by a health care professional. Talk to your pediatrician regarding the use of this medicine in children. Special care may be needed. Overdosage: If you think you have taken too much of this medicine contact a poison control center or emergency room at once. NOTE: This medicine is only for you. Do not share this medicine with others. What if I miss a dose? This does not apply. Depending on the brand of device you have inserted, the device will need to be replaced every 3 to 5 years if you wish to continue using this type of birth  control. What may interact with this medicine? Do not take this medicine with any of the following medications: -amprenavir -bosentan -fosamprenavir This medicine may also interact with the following medications: -aprepitant -armodafinil -barbiturate medicines for inducing sleep or treating seizures -bexarotene -boceprevir -griseofulvin -medicines to treat seizures like carbamazepine, ethotoin, felbamate, oxcarbazepine, phenytoin, topiramate -modafinil -pioglitazone -rifabutin -rifampin -rifapentine -some medicines to treat HIV infection like atazanavir, efavirenz, indinavir, lopinavir, nelfinavir, tipranavir, ritonavir -St. John's wort -warfarin This list may not describe all possible interactions. Give your health care provider a list of all the medicines, herbs, non-prescription drugs, or dietary supplements you use. Also tell them if you smoke, drink alcohol, or use illegal drugs. Some items may interact with your medicine. What should I watch for while using this medicine? Visit your doctor or health care professional for regular check ups. See your doctor if you or your partner has sexual contact with others, becomes HIV positive, or gets a sexual transmitted disease. This product does not protect you against HIV infection (AIDS) or other sexually transmitted diseases. You can check the placement of the IUD yourself by reaching up to the top of your vagina with clean fingers to feel the threads. Do not pull on the threads. It is a good habit to check placement after each menstrual period. Call your doctor right away if you feel more of the IUD than just the threads or if you cannot feel the threads at all. The IUD may come out by itself. You may become pregnant if the device comes out. If you notice that the IUD has come out use a backup birth control method like condoms and call your   health care provider. Using tampons will not change the position of the IUD and are okay to use  during your period. This IUD can be safely scanned with magnetic resonance imaging (MRI) only under specific conditions. Before you have an MRI, tell your healthcare provider that you have an IUD in place, and which type of IUD you have in place. What side effects may I notice from receiving this medicine? Side effects that you should report to your doctor or health care professional as soon as possible: -allergic reactions like skin rash, itching or hives, swelling of the face, lips, or tongue -fever, flu-like symptoms -genital sores -high blood pressure -no menstrual period for 6 weeks during use -pain, swelling, warmth in the leg -pelvic pain or tenderness -severe or sudden headache -signs of pregnancy -stomach cramping -sudden shortness of breath -trouble with balance, talking, or walking -unusual vaginal bleeding, discharge -yellowing of the eyes or skin Side effects that usually do not require medical attention (report to your doctor or health care professional if they continue or are bothersome): -acne -breast pain -change in sex drive or performance -changes in weight -cramping, dizziness, or faintness while the device is being inserted -headache -irregular menstrual bleeding within first 3 to 6 months of use -nausea This list may not describe all possible side effects. Call your doctor for medical advice about side effects. You may report side effects to FDA at 1-800-FDA-1088. Where should I keep my medicine? This does not apply. NOTE: This sheet is a summary. It may not cover all possible information. If you have questions about this medicine, talk to your doctor, pharmacist, or health care provider.  2018 Elsevier/Gold Standard (2015-12-08 14:14:56)   Ethinyl Estradiol; Etonogestrel vaginal ring What is this medicine? ETHINYL ESTRADIOL; ETONOGESTREL (ETH in il es tra DYE ole; et oh noe JES trel) vaginal ring is a flexible, vaginal ring used as a contraceptive (birth  control method). This medicine combines two types of female hormones, an estrogen and a progestin. This ring is used to prevent ovulation and pregnancy. Each ring is effective for one month. This medicine may be used for other purposes; ask your health care provider or pharmacist if you have questions. COMMON BRAND NAME(S): NuvaRing What should I tell my health care provider before I take this medicine? They need to know if you have or ever had any of these conditions: -abnormal vaginal bleeding -blood vessel disease or blood clots -breast, cervical, endometrial, ovarian, liver, or uterine cancer -diabetes -gallbladder disease -heart disease or recent heart attack -high blood pressure -high cholesterol -kidney disease -liver disease -migraine headaches -stroke -systemic lupus erythematosus (SLE) -tobacco smoker -an unusual or allergic reaction to estrogens, progestins, other medicines, foods, dyes, or preservatives -pregnant or trying to get pregnant -breast-feeding How should I use this medicine? Insert the ring into your vagina as directed. Follow the directions on the prescription label. The ring will remain place for 3 weeks and is then removed for a 1-week break. A new ring is inserted 1 week after the last ring was removed, on the same day of the week. Check often to make sure the ring is still in place, especially before and after sexual intercourse. If the ring was out of the vagina for an unknown amount of time, you may not be protected from pregnancy. Perform a pregnancy test and call your doctor. Do not use more often than directed. A patient package insert for the product will be given with each prescription and refill. Read  this sheet carefully each time. The sheet may change frequently. Contact your pediatrician regarding the use of this medicine in children. Special care may be needed. This medicine has been used in female children who have started having menstrual  periods. Overdosage: If you think you have taken too much of this medicine contact a poison control center or emergency room at once. NOTE: This medicine is only for you. Do not share this medicine with others. What if I miss a dose? You will need to replace your vaginal ring once a month as directed. If the ring should slip out, or if you leave it in longer or shorter than you should, contact your health care professional for advice. What may interact with this medicine? Do not take this medicine with the following medication: -dasabuvir; ombitasvir; paritaprevir; ritonavir -ombitasvir; paritaprevir; ritonavir This medicine may also interact with the following medications: -acetaminophen -antibiotics or medicines for infections, especially rifampin, rifabutin, rifapentine, and griseofulvin, and possibly penicillins or tetracyclines -aprepitant -ascorbic acid (vitamin C) -atorvastatin -barbiturate medicines, such as phenobarbital -bosentan -carbamazepine -caffeine -clofibrate -cyclosporine -dantrolene -doxercalciferol -felbamate -grapefruit juice -hydrocortisone -medicines for anxiety or sleeping problems, such as diazepam or temazepam -medicines for diabetes, including pioglitazone -modafinil -mycophenolate -nefazodone -oxcarbazepine -phenytoin -prednisolone -ritonavir or other medicines for HIV infection or AIDS -rosuvastatin -selegiline -soy isoflavones supplements -St. John's wort -tamoxifen or raloxifene -theophylline -thyroid hormones -topiramate -warfarin This list may not describe all possible interactions. Give your health care provider a list of all the medicines, herbs, non-prescription drugs, or dietary supplements you use. Also tell them if you smoke, drink alcohol, or use illegal drugs. Some items may interact with your medicine. What should I watch for while using this medicine? Visit your doctor or health care professional for regular checks on your  progress. You will need a regular breast and pelvic exam and Pap smear while on this medicine. Use an additional method of contraception during the first cycle that you use this ring. Do not use a diaphragm or female condom, as the ring can interfere with these birth control methods and their proper placement. If you have any reason to think you are pregnant, stop using this medicine right away and contact your doctor or health care professional. If you are using this medicine for hormone related problems, it may take several cycles of use to see improvement in your condition. Smoking increases the risk of getting a blood clot or having a stroke while you are using hormonal birth control, especially if you are more than 28 years old. You are strongly advised not to smoke. This medicine can make your body retain fluid, making your fingers, hands, or ankles swell. Your blood pressure can go up. Contact your doctor or health care professional if you feel you are retaining fluid. This medicine can make you more sensitive to the sun. Keep out of the sun. If you cannot avoid being in the sun, wear protective clothing and use sunscreen. Do not use sun lamps or tanning beds/booths. If you wear contact lenses and notice visual changes, or if the lenses begin to feel uncomfortable, consult your eye care specialist. In some women, tenderness, swelling, or minor bleeding of the gums may occur. Notify your dentist if this happens. Brushing and flossing your teeth regularly may help limit this. See your dentist regularly and inform your dentist of the medicines you are taking. If you are going to have elective surgery, you may need to stop using this medicine before the surgery. Consult  your health care professional for advice. This medicine does not protect you against HIV infection (AIDS) or any other sexually transmitted diseases. What side effects may I notice from receiving this medicine? Side effects that you  should report to your doctor or health care professional as soon as possible: -breast tissue changes or discharge -changes in vaginal bleeding during your period or between your periods -chest pain -coughing up blood -dizziness or fainting spells -headaches or migraines -leg, arm or groin pain -severe or sudden headaches -stomach pain (severe) -sudden shortness of breath -sudden loss of coordination, especially on one side of the body -speech problems -symptoms of vaginal infection like itching, irritation or unusual discharge -tenderness in the upper abdomen -vomiting -weakness or numbness in the arms or legs, especially on one side of the body -yellowing of the eyes or skin Side effects that usually do not require medical attention (report to your doctor or health care professional if they continue or are bothersome): -breakthrough bleeding and spotting that continues beyond the 3 initial cycles of pills -breast tenderness -mood changes, anxiety, depression, frustration, anger, or emotional outbursts -increased sensitivity to sun or ultraviolet light -nausea -skin rash, acne, or brown spots on the skin -weight gain (slight) This list may not describe all possible side effects. Call your doctor for medical advice about side effects. You may report side effects to FDA at 1-800-FDA-1088. Where should I keep my medicine? Keep out of the reach of children. Store at room temperature between 15 and 30 degrees C (59 and 86 degrees F) for up to 4 months. The product will expire after 4 months. Protect from light. Throw away any unused medicine after the expiration date. NOTE: This sheet is a summary. It may not cover all possible information. If you have questions about this medicine, talk to your doctor, pharmacist, or health care provider.  2018 Elsevier/Gold Standard (2015-11-03 17:00:31)

## 2017-12-11 ENCOUNTER — Ambulatory Visit: Payer: Medicaid Other | Admitting: Family Medicine

## 2017-12-23 ENCOUNTER — Ambulatory Visit (INDEPENDENT_AMBULATORY_CARE_PROVIDER_SITE_OTHER): Payer: Medicaid Other | Admitting: Advanced Practice Midwife

## 2017-12-23 VITALS — BP 114/77 | HR 69 | Ht 66.0 in | Wt 217.1 lb

## 2017-12-23 DIAGNOSIS — Z23 Encounter for immunization: Secondary | ICD-10-CM | POA: Diagnosis not present

## 2017-12-23 DIAGNOSIS — N939 Abnormal uterine and vaginal bleeding, unspecified: Secondary | ICD-10-CM | POA: Diagnosis not present

## 2017-12-23 DIAGNOSIS — R110 Nausea: Secondary | ICD-10-CM

## 2017-12-23 DIAGNOSIS — Z3009 Encounter for other general counseling and advice on contraception: Secondary | ICD-10-CM | POA: Diagnosis not present

## 2017-12-23 MED ORDER — ONDANSETRON HCL 4 MG PO TABS: 4.0000 mg | ORAL_TABLET | Freq: Three times a day (TID) | ORAL | 1 refills | Status: DC | PRN

## 2017-12-23 MED ORDER — ETONOGESTREL-ETHINYL ESTRADIOL 0.12-0.015 MG/24HR VA RING: VAGINAL_RING | VAGINAL | 12 refills | Status: DC

## 2017-12-23 MED FILL — NUVARING VAGINAL RING: 0.12-0.015 | 28 days supply | Qty: 1 | Fill #0

## 2017-12-23 MED FILL — ONDANSETRON HCL 4 MG TABLET: 4 | 6 days supply | Qty: 20 | Fill #0

## 2017-12-23 MED FILL — PANTOPRAZOLE SOD DR 40 MG T: 40 | 30 days supply | Qty: 30 | Fill #1

## 2017-12-23 NOTE — Progress Notes (Signed)
I reviewed the note and agree with the nursing assessment and plan.   Lance Galas, CNM 06/06/2017 10:32 AM   

## 2017-12-23 NOTE — Addendum Note (Signed)
Addended by: Manya Silvas on: 12/23/2017 11:47 AM   Modules accepted: Orders, Level of Service

## 2017-12-23 NOTE — Patient Instructions (Signed)
Ethinyl Estradiol; Etonogestrel vaginal ring What is this medicine? ETHINYL ESTRADIOL; ETONOGESTREL (ETH in il es tra DYE ole; et oh noe JES trel) vaginal ring is a flexible, vaginal ring used as a contraceptive (birth control method). This medicine combines two types of female hormones, an estrogen and a progestin. This ring is used to prevent ovulation and pregnancy. Each ring is effective for one month. This medicine may be used for other purposes; ask your health care provider or pharmacist if you have questions. COMMON BRAND NAME(S): NuvaRing What should I tell my health care provider before I take this medicine? They need to know if you have or ever had any of these conditions: -abnormal vaginal bleeding -blood vessel disease or blood clots -breast, cervical, endometrial, ovarian, liver, or uterine cancer -diabetes -gallbladder disease -heart disease or recent heart attack -high blood pressure -high cholesterol -kidney disease -liver disease -migraine headaches -stroke -systemic lupus erythematosus (SLE) -tobacco smoker -an unusual or allergic reaction to estrogens, progestins, other medicines, foods, dyes, or preservatives -pregnant or trying to get pregnant -breast-feeding How should I use this medicine? Insert the ring into your vagina as directed. Follow the directions on the prescription label. The ring will remain place for 3 weeks and is then removed for a 1-week break. A new ring is inserted 1 week after the last ring was removed, on the same day of the week. Check often to make sure the ring is still in place, especially before and after sexual intercourse. If the ring was out of the vagina for an unknown amount of time, you may not be protected from pregnancy. Perform a pregnancy test and call your doctor. Do not use more often than directed. A patient package insert for the product will be given with each prescription and refill. Read this sheet carefully each time. The  sheet may change frequently. Contact your pediatrician regarding the use of this medicine in children. Special care may be needed. This medicine has been used in female children who have started having menstrual periods. Overdosage: If you think you have taken too much of this medicine contact a poison control center or emergency room at once. NOTE: This medicine is only for you. Do not share this medicine with others. What if I miss a dose? You will need to replace your vaginal ring once a month as directed. If the ring should slip out, or if you leave it in longer or shorter than you should, contact your health care professional for advice. What may interact with this medicine? Do not take this medicine with the following medication: -dasabuvir; ombitasvir; paritaprevir; ritonavir -ombitasvir; paritaprevir; ritonavir This medicine may also interact with the following medications: -acetaminophen -antibiotics or medicines for infections, especially rifampin, rifabutin, rifapentine, and griseofulvin, and possibly penicillins or tetracyclines -aprepitant -ascorbic acid (vitamin C) -atorvastatin -barbiturate medicines, such as phenobarbital -bosentan -carbamazepine -caffeine -clofibrate -cyclosporine -dantrolene -doxercalciferol -felbamate -grapefruit juice -hydrocortisone -medicines for anxiety or sleeping problems, such as diazepam or temazepam -medicines for diabetes, including pioglitazone -modafinil -mycophenolate -nefazodone -oxcarbazepine -phenytoin -prednisolone -ritonavir or other medicines for HIV infection or AIDS -rosuvastatin -selegiline -soy isoflavones supplements -St. John's wort -tamoxifen or raloxifene -theophylline -thyroid hormones -topiramate -warfarin This list may not describe all possible interactions. Give your health care provider a list of all the medicines, herbs, non-prescription drugs, or dietary supplements you use. Also tell them if you smoke,  drink alcohol, or use illegal drugs. Some items may interact with your medicine. What should I watch for while using   this medicine? Visit your doctor or health care professional for regular checks on your progress. You will need a regular breast and pelvic exam and Pap smear while on this medicine. Use an additional method of contraception during the first cycle that you use this ring. Do not use a diaphragm or female condom, as the ring can interfere with these birth control methods and their proper placement. If you have any reason to think you are pregnant, stop using this medicine right away and contact your doctor or health care professional. If you are using this medicine for hormone related problems, it may take several cycles of use to see improvement in your condition. Smoking increases the risk of getting a blood clot or having a stroke while you are using hormonal birth control, especially if you are more than 28 years old. You are strongly advised not to smoke. This medicine can make your body retain fluid, making your fingers, hands, or ankles swell. Your blood pressure can go up. Contact your doctor or health care professional if you feel you are retaining fluid. This medicine can make you more sensitive to the sun. Keep out of the sun. If you cannot avoid being in the sun, wear protective clothing and use sunscreen. Do not use sun lamps or tanning beds/booths. If you wear contact lenses and notice visual changes, or if the lenses begin to feel uncomfortable, consult your eye care specialist. In some women, tenderness, swelling, or minor bleeding of the gums may occur. Notify your dentist if this happens. Brushing and flossing your teeth regularly may help limit this. See your dentist regularly and inform your dentist of the medicines you are taking. If you are going to have elective surgery, you may need to stop using this medicine before the surgery. Consult your health care professional  for advice. This medicine does not protect you against HIV infection (AIDS) or any other sexually transmitted diseases. What side effects may I notice from receiving this medicine? Side effects that you should report to your doctor or health care professional as soon as possible: -breast tissue changes or discharge -changes in vaginal bleeding during your period or between your periods -chest pain -coughing up blood -dizziness or fainting spells -headaches or migraines -leg, arm or groin pain -severe or sudden headaches -stomach pain (severe) -sudden shortness of breath -sudden loss of coordination, especially on one side of the body -speech problems -symptoms of vaginal infection like itching, irritation or unusual discharge -tenderness in the upper abdomen -vomiting -weakness or numbness in the arms or legs, especially on one side of the body -yellowing of the eyes or skin Side effects that usually do not require medical attention (report to your doctor or health care professional if they continue or are bothersome): -breakthrough bleeding and spotting that continues beyond the 3 initial cycles of pills -breast tenderness -mood changes, anxiety, depression, frustration, anger, or emotional outbursts -increased sensitivity to sun or ultraviolet light -nausea -skin rash, acne, or brown spots on the skin -weight gain (slight) This list may not describe all possible side effects. Call your doctor for medical advice about side effects. You may report side effects to FDA at 1-800-FDA-1088. Where should I keep my medicine? Keep out of the reach of children. Store at room temperature between 15 and 30 degrees C (59 and 86 degrees F) for up to 4 months. The product will expire after 4 months. Protect from light. Throw away any unused medicine after the expiration date. NOTE: This   sheet is a summary. It may not cover all possible information. If you have questions about this medicine, talk  to your doctor, pharmacist, or health care provider.  2018 Elsevier/Gold Standard (2015-11-03 17:00:31)  

## 2017-12-23 NOTE — Progress Notes (Signed)
  Subjective:     Patient ID: Penny Klein, female   DOB: 01/25/1991, 27 y.o.   MRN: [id number]  HPI: Here for flu vaccine. Has several questions or provider about menstrual bleeding, starting Nuvaring, Nausea since 12/19/17. Has appt scheduled 01/08/18 for contraceptive counseling. Tried to have IC 10/10. but was unable to tolerate penetration due to tenderness where stitches were. No pain otherwise.   Started first menstrual period since delivery. Bleeding is normal, but has is concerned because she has passed some clots.   Review of Systems  Constitutional: Negative for fever.  Gastrointestinal: Positive for nausea. Negative for abdominal pain, diarrhea and vomiting.  Genitourinary: Positive for vaginal bleeding.       Tenderness at site of stitches from obstetric laceration.     Past Medical History:  Diagnosis Date  . Abnormal Pap smear of cervix 2011  . Dysmenorrhea   . Leiomyoma of uterus   . Menometrorrhagia    Family History  Problem Relation Age of Onset  . Lupus Paternal Uncle   . Hypertension Mother   . Hyperlipidemia Mother   . Obesity Mother   . Hypertension Father   . Anxiety disorder Father   . Hyperlipidemia Father   . Breast cancer Maternal Aunt   . Breast cancer Maternal Grandmother   . Stomach cancer Paternal Grandmother   . Breast cancer Maternal Aunt        Objective:BP 114/77   Pulse 69   Ht 5\' 6"  (1.676 m)   Wt 217 lb 1.9 oz (98.5 kg)   BMI 35.04 kg/m    Physical Exam  Constitutional: She is oriented to person, place, and time. She appears well-developed and well-nourished.  Cardiovascular: Normal rate.  Pulmonary/Chest: Effort normal.  Neurological: She is alert and oriented to person, place, and time.  Skin: Skin is warm and dry.  Psychiatric: She has a normal mood and affect.       Assessment/Plan:     1. Encounter for administration of vaccine  - Flu Vaccine QUAD 36+ mos IM  2. Nausea  - ondansetron (ZOFRAN) 4 MG tablet; Take 1  tablet (4 mg total) by mouth every 8 (eight) hours as needed for nausea or vomiting.  Dispense: 20 tablet; Refill: 1 - If no resolution in 1 week pt needs to see PCP.   3. Birth control counseling  - Do not have IC until [facility] is established. Consider yourself fertile.  - Will have pt returne as scheduled to practice placing Nuvaring w/ provider's assistance.  - etonogestrel-ethinyl estradiol (NUVARING) 0.12-0.015 MG/24HR vaginal ring; Insert vaginally and leave in place for 3 consecutive weeks, then remove for 1 week.  Dispense: 1 each; Refill: 12  4. AUB--likely due to Nml changes after pregnancy  - Informed may take ~3 cycles to get back to normal (or new normal) after delivery - Discuss at [facility] if concerns/bleeding persist.  - Call office or go to [facility] for severe bleeding.   Tamala Julian, [place], [place] 12/23/2017 11:44 AM

## 2017-12-23 NOTE — Progress Notes (Signed)
Penny Klein here for Flu vaccine  Injection.  Injection administered without complication. Patient will return in N/A for next injection.  Penny Klein l Khya Halls, CMA 12/23/2017  11:14 AM

## 2018-01-08 ENCOUNTER — Ambulatory Visit: Payer: Medicaid Other | Admitting: Family Medicine

## 2018-01-08 ENCOUNTER — Encounter: Payer: Self-pay | Admitting: Family Medicine

## 2018-01-08 ENCOUNTER — Ambulatory Visit (INDEPENDENT_AMBULATORY_CARE_PROVIDER_SITE_OTHER): Payer: Medicaid Other | Admitting: Family Medicine

## 2018-01-08 VITALS — BP 129/75 | HR 79 | Wt 218.0 lb

## 2018-01-08 DIAGNOSIS — Z3009 Encounter for other general counseling and advice on contraception: Secondary | ICD-10-CM

## 2018-01-08 NOTE — Progress Notes (Signed)
   Subjective:    Patient ID: Penny Klein, female    DOB: [birth year], 27 y.o.   MRN: [id number]  HPI Patient seen for contraception concerns. Had talked with CNm about nuvaring. Hasn't picked it up yet.    Review of Systems     Objective:   Physical Exam  Constitutional: She is oriented to person, place, and time. She appears well-developed and well-nourished.  HENT:  Head: Normocephalic and atraumatic.  Neurological: She is alert and oriented to person, place, and time.  Skin: Skin is warm and dry. Capillary refill takes less than 2 seconds.  Psychiatric: She has a normal mood and affect. Her behavior is normal. Judgment and thought content normal.      Assessment & Plan:  1. Birth control counseling Discussed use of nuvaring - how to use, continuous hormone vs interrupted. Discussed side effects, etc. Patient to start soon. Follow up in 3 months for annual and to see how patient tolerates.

## 2018-04-02 ENCOUNTER — Other Ambulatory Visit (HOSPITAL_COMMUNITY)
Admission: RE | Admit: 2018-04-02 | Discharge: 2018-04-02 | Disposition: A | Discharge status: Home / Self Care | Payer: Medicaid Other | Source: Ambulatory Visit | Attending: Family Medicine | Admitting: Family Medicine

## 2018-04-02 ENCOUNTER — Encounter: Payer: Self-pay | Admitting: Family Medicine

## 2018-04-02 ENCOUNTER — Ambulatory Visit (INDEPENDENT_AMBULATORY_CARE_PROVIDER_SITE_OTHER): Payer: Medicaid Other | Admitting: Family Medicine

## 2018-04-02 VITALS — BP 126/85 | HR 78

## 2018-04-02 DIAGNOSIS — Z30430 Encounter for insertion of intrauterine contraceptive device: Secondary | ICD-10-CM | POA: Insufficient documentation

## 2018-04-02 DIAGNOSIS — Z0000 Encounter for general adult medical examination without abnormal findings: Secondary | ICD-10-CM

## 2018-04-02 DIAGNOSIS — Z01419 Encounter for gynecological examination (general) (routine) without abnormal findings: Secondary | ICD-10-CM | POA: Diagnosis not present

## 2018-04-02 DIAGNOSIS — N898 Other specified noninflammatory disorders of vagina: Secondary | ICD-10-CM | POA: Diagnosis present

## 2018-04-02 DIAGNOSIS — Z3202 Encounter for pregnancy test, result negative: Secondary | ICD-10-CM

## 2018-04-02 LAB — POCT URINE PREGNANCY: PREG TEST UR: NEGATIVE

## 2018-04-02 MED ORDER — LEVONORGESTREL 19.5 MCG/DAY IU IUD
INTRAUTERINE_SYSTEM | Freq: Once | INTRAUTERINE | Status: AC
  Administered 2018-04-02: 1 via INTRAUTERINE

## 2018-04-02 NOTE — Patient Instructions (Signed)

## 2018-04-02 NOTE — Progress Notes (Signed)
IUD Procedure Note Patient identified, informed consent performed, signed copy in chart, time out was performed.  Urine pregnancy test negative.  Speculum placed in the vagina.  Cervix visualized.  Cleaned with Betadine x 2.  Grasped anteriorly with a single tooth tenaculum.  Uterus sounded to 9 cm.  [place]  IUD placed per manufacturer's recommendations.  Strings trimmed to 3 cm. Tenaculum was removed, good hemostasis noted.  Patient tolerated procedure well.   Patient given post procedure instructions and Liletta care card with expiration date.  Patient is asked to check IUD strings periodically and follow up in 4-6 weeks for IUD check.

## 2018-04-02 NOTE — Progress Notes (Signed)
GYNECOLOGY ANNUAL PREVENTATIVE CARE ENCOUNTER NOTE  Subjective:   Penny Klein is a 28 y.o. G40P2002 female here for a routine annual gynecologic exam.  Current complaints: none.   Denies abnormal vaginal bleeding, discharge, pelvic pain, problems with intercourse or other gynecologic concerns.    Gynecologic History Patient's last menstrual period was 03/14/2018 (exact date). Patient is sexually active  Contraception: none Last Pap: uncertain. Results were: normal Last mammogram: n/a  Obstetric History OB History  Gravida Para Term Preterm AB Living  3 2 2     2   SAB TAB Ectopic Multiple Live Births          2    # Outcome Date GA Lbr Len/2nd Weight Sex Delivery Anes PTL Lv  3 Gravida           2 Term 07/30/12 [redacted]w[redacted]d  7 lb (3.175 kg) F Vag-Spont   LIV  1 Term 03/14/09 [redacted]w[redacted]d  7 lb 11 oz (3.487 kg) M Vag-Spont   LIV    Past Medical History:  Diagnosis Date  . Abnormal Pap smear of cervix 2011  . Dysmenorrhea   . Leiomyoma of uterus   . Menometrorrhagia     Past Surgical History:  Procedure Laterality Date  . LEEP      Current Outpatient Medications on File Prior to Visit  Medication Sig Dispense Refill  . Prenatal Vit-Fe Fumarate-FA (MULTIVITAMIN-PRENATAL) 27-0.8 MG TABS tablet Take 1 tablet by mouth daily at 12 noon.    . etonogestrel-ethinyl estradiol (NUVARING) 0.12-0.015 MG/24HR vaginal ring Insert vaginally and leave in place for 3 consecutive weeks, then remove for 1 week. (Patient not taking: Reported on 01/08/2018) 1 each 12   No current facility-administered medications on file prior to visit.     No Known Allergies  Social History   Socioeconomic History  . Marital status: Married    Spouse name: Not on file  . Number of children: Not on file  . Years of education: Not on file  . Highest education level: Not on file  Occupational History  . Not on file  Social Needs  . Financial resource strain: Not on file  . Food insecurity:    Worry: Not  on file    Inability: Not on file  . Transportation needs:    Medical: Not on file    Non-medical: Not on file  Tobacco Use  . Smoking status: Never Smoker  . Smokeless tobacco: Never Used  Substance and Sexual Activity  . Alcohol use: No    Frequency: Never    Comment: <1 per month  . Drug use: No  . Sexual activity: Not Currently  Lifestyle  . Physical activity:    Days per week: Not on file    Minutes per session: Not on file  . Stress: Not on file  Relationships  . Social connections:    Talks on phone: Not on file    Gets together: Not on file    Attends religious service: Not on file    Active member of club or organization: Not on file    Attends meetings of clubs or organizations: Not on file    Relationship status: Not on file  . Intimate partner violence:    Fear of current or ex partner: Not on file    Emotionally abused: Not on file    Physically abused: Not on file    Forced sexual activity: Not on file  Other Topics Concern  . Not on file  Social History Narrative  . Not on file    Family History  Problem Relation Age of Onset  . Lupus Paternal Uncle   . Hypertension Mother   . Hyperlipidemia Mother   . Obesity Mother   . Hypertension Father   . Anxiety disorder Father   . Hyperlipidemia Father   . Breast cancer Maternal Aunt   . Breast cancer Maternal Grandmother   . Stomach cancer Paternal Grandmother   . Breast cancer Maternal Aunt     The following portions of the patient's history were reviewed and updated as appropriate: allergies, current medications, past family history, past medical history, past social history, past surgical history and problem list.  Review of Systems Pertinent items are noted in HPI.   Objective:  BP 126/85   Pulse 78   LMP 03/14/2018 (Exact Date)  Wt Readings from Last 3 Encounters:  01/08/18 218 lb (98.9 kg)  12/23/17 217 lb 1.9 oz (98.5 kg)  11/20/17 215 lb (97.5 kg)     CONSTITUTIONAL: Well-developed,  well-nourished female in no acute distress.  HENT:  Normocephalic, atraumatic, External right and left ear normal. Oropharynx is clear and moist EYES: Conjunctivae and EOM are normal. Pupils are equal, round, and reactive to light. No scleral icterus.  NECK: Normal range of motion, supple, no masses.  Normal thyroid.   CARDIOVASCULAR: Normal heart rate noted, regular rhythm RESPIRATORY: Clear to auscultation bilaterally. Effort and breath sounds normal, no problems with respiration noted. BREASTS: Symmetric in size. No masses, skin changes, nipple drainage, or lymphadenopathy. ABDOMEN: Soft, normal bowel sounds, no distention noted.  No tenderness, rebound or guarding.  PELVIC: Normal appearing external genitalia; normal appearing vaginal mucosa and cervix.  No abnormal discharge noted.  Normal uterine size, no other palpable masses, no uterine or adnexal tenderness. MUSCULOSKELETAL: Normal range of motion. No tenderness.  No cyanosis, clubbing, or edema.  2+ distal pulses. SKIN: Skin is warm and dry. No rash noted. Not diaphoretic. No erythema. No pallor. NEUROLOGIC: Alert and oriented to person, place, and time. Normal reflexes, muscle tone coordination. No cranial nerve deficit noted. PSYCHIATRIC: Normal mood and affect. Normal behavior. Normal judgment and thought content.  Assessment:  Annual gynecologic examination with pap smear   Plan:  1. Well Woman Exam Will follow up results of pap smear and manage accordingly.  2. IUD insertion Return in 1 month  3. Vaginal irritation Wet prep  Routine preventative health maintenance measures emphasized. Please refer to After Visit Summary for other counseling recommendations.    Loma Boston, [place] for [employer]

## 2018-04-06 ENCOUNTER — Other Ambulatory Visit: Payer: Self-pay | Admitting: Family Medicine

## 2018-04-06 LAB — CYTOLOGY - PAP
Bacterial vaginitis: POSITIVE — AB
CANDIDA VAGINITIS: POSITIVE — AB
CHLAMYDIA, DNA PROBE: NEGATIVE
DIAGNOSIS: NEGATIVE
Neisseria Gonorrhea: NEGATIVE
TRICH (WINDOWPATH): NEGATIVE

## 2018-04-06 MED ORDER — METRONIDAZOLE 500 MG PO TABS: 500.0000 mg | ORAL_TABLET | Freq: Two times a day (BID) | ORAL | 0 refills | Status: DC

## 2018-04-06 MED ORDER — FLUCONAZOLE 150 MG PO TABS: 150.0000 mg | ORAL_TABLET | Freq: Once | ORAL | 0 refills | Status: AC

## 2018-04-06 MED FILL — FLUCONAZOLE 150 MG TABS: 150 | 1 days supply | Qty: 1 | Fill #0

## 2018-04-06 MED FILL — metroNIDAZOLE 500 MG TABS: 500 | 7 days supply | Qty: 14 | Fill #0

## 2018-04-07 ENCOUNTER — Telehealth: Payer: Self-pay

## 2018-04-07 NOTE — Telephone Encounter (Signed)
Patient already aware of BV and yeast results. [name] RN

## 2018-04-07 NOTE — Telephone Encounter (Signed)
Called pt with positive BV and yeast results. Voicemail has not been set up,unable to leave message. Penny Klein l Quince Santana, CMA

## 2018-04-30 ENCOUNTER — Encounter: Payer: Self-pay | Admitting: Family Medicine

## 2018-04-30 ENCOUNTER — Ambulatory Visit (INDEPENDENT_AMBULATORY_CARE_PROVIDER_SITE_OTHER): Payer: Medicaid Other | Admitting: Family Medicine

## 2018-04-30 VITALS — BP 117/74 | HR 72 | Wt 217.0 lb

## 2018-04-30 DIAGNOSIS — Z30431 Encounter for routine checking of intrauterine contraceptive device: Secondary | ICD-10-CM | POA: Diagnosis not present

## 2018-04-30 MED ORDER — MEDROXYPROGESTERONE ACETATE 10 MG PO TABS: 10.0000 mg | ORAL_TABLET | Freq: Every day | ORAL | 0 refills | Status: DC

## 2018-04-30 MED FILL — MEDROXYPROGESTERONE 10 MG T: 10 | 30 days supply | Qty: 30 | Fill #0

## 2018-04-30 NOTE — Progress Notes (Signed)
   Subjective:   Patient Name: Penny Klein, female   DOB: August 26, 1990, 28 y.o.  MRN: [id number]  HPI Patient here for an IUD check.  She had the [place] IUD placed 1 month ago.  She reports daily spotting.   Review of Systems  Constitutional: Negative for fever and chills.  Gastrointestinal: Negative for abdominal pain.  Genitourinary: Negative for vaginal discharge, vaginal pain, pelvic pain and dyspareunia.        Objective:   Physical Exam  Constitutional: She appears well-developed and well-nourished.  HENT:  Head: Normocephalic and atraumatic.  Abdominal: Soft. There is no tenderness. There is no guarding.  Genitourinary: There is no rash, tenderness or lesion on the right labia. There is no rash, tenderness or lesion on the left labia. No erythema or tenderness in the vagina. No foreign body around the vagina. No signs of injury around the vagina. No vaginal discharge found.    Skin: Skin is warm and dry.  Psychiatric: She has a normal mood and affect. Her behavior is normal. Judgment and thought content normal.       Assessment & Plan:  1. IUD check up IUD in place.  Provera x 30 days. Pt to call with any other problems.  Recheck in 1 year.

## 2018-04-30 NOTE — Progress Notes (Signed)
Patient has experienced some light bleeding since insertion. [name] RN

## 2018-05-09 ENCOUNTER — Other Ambulatory Visit: Payer: Self-pay

## 2018-05-09 ENCOUNTER — Emergency Department (HOSPITAL_BASED_OUTPATIENT_CLINIC_OR_DEPARTMENT_OTHER)
Admission: EM | Admit: 2018-05-09 | Discharge: 2018-05-09 | Disposition: A | Discharge status: Home / Self Care | Payer: Medicaid Other | Attending: Emergency Medicine | Admitting: Emergency Medicine

## 2018-05-09 ENCOUNTER — Encounter (HOSPITAL_BASED_OUTPATIENT_CLINIC_OR_DEPARTMENT_OTHER): Payer: Self-pay | Admitting: *Deleted

## 2018-05-09 DIAGNOSIS — S61210A Laceration without foreign body of right index finger without damage to nail, initial encounter: Secondary | ICD-10-CM | POA: Diagnosis not present

## 2018-05-09 DIAGNOSIS — Y929 Unspecified place or not applicable: Secondary | ICD-10-CM | POA: Insufficient documentation

## 2018-05-09 DIAGNOSIS — W268XXA Contact with other sharp object(s), not elsewhere classified, initial encounter: Secondary | ICD-10-CM | POA: Diagnosis not present

## 2018-05-09 DIAGNOSIS — M25532 Pain in left wrist: Secondary | ICD-10-CM | POA: Diagnosis not present

## 2018-05-09 DIAGNOSIS — Y999 Unspecified external cause status: Secondary | ICD-10-CM | POA: Insufficient documentation

## 2018-05-09 DIAGNOSIS — Y93G1 Activity, food preparation and clean up: Secondary | ICD-10-CM | POA: Diagnosis not present

## 2018-05-09 MED ORDER — LIDOCAINE-EPINEPHRINE (PF) 1 %-1:200000 IJ SOLN
INTRAMUSCULAR | Status: AC
  Filled 2018-05-09: qty 10

## 2018-05-09 NOTE — ED Provider Notes (Signed)
[facility] EMERGENCY DEPARTMENT Provider Note   CSN: [id number] Arrival date & time: 05/09/18  2114    History   Chief Complaint Chief Complaint  Patient presents with  . Laceration    HPI Penny Klein is a 28 y.o. female.     HPI Patient presents to the emergency room for evaluation of a finger laceration.  Patient states she cut her left index finger yesterday while washing dishes.  She washed the wound yesterday and placed a bandage on it.  She went to work today but wondered if she needed to have stitches so she came in for evaluation.  Patient denies any numbness or weakness.  Patient also has had pain in her left wrist since August.  Patient states certain movements cause pain.  If she bumps her wrist it becomes more severe.  She denies any recent falls or injuries.  No swelling.  No numbness or weakness. Past Medical History:  Diagnosis Date  . Abnormal Pap smear of cervix 2011  . Dysmenorrhea   . Leiomyoma of uterus   . Menometrorrhagia     Patient Active Problem List   Diagnosis Date Noted  . History of cervical LEEP biopsy affecting care of mother, antepartum 10/02/2017  . Vitamin D deficiency 03/17/2017  . Abnormal weight gain 12/23/2016  . Class 1 obesity due to excess calories without serious comorbidity with body mass index (BMI) of 33.0 to 33.9 in adult 12/23/2016    Past Surgical History:  Procedure Laterality Date  . LEEP       OB History    Gravida  3   Para  2   Term  2   Preterm      AB      Living  2     SAB      TAB      Ectopic      Multiple      Live Births  2            Home Medications    Prior to Admission medications   Medication Sig Start Date End Date Taking? Authorizing Provider  medroxyPROGESTERone (PROVERA) 10 MG tablet Take 1 tablet (10 mg total) by mouth daily for 30 days. 04/30/18 05/30/18 Yes Truett Mainland, DO  metroNIDAZOLE (FLAGYL) 500 MG tablet Take 1 tablet (500 mg total) by mouth 2  (two) times daily. 04/06/18   Truett Mainland, DO  Prenatal Vit-Fe Fumarate-FA (MULTIVITAMIN-PRENATAL) 27-0.8 MG TABS tablet Take 1 tablet by mouth daily at 12 noon.    [provider]    Family History Family History  Problem Relation Age of Onset  . Lupus Paternal Uncle   . Hypertension Mother   . Hyperlipidemia Mother   . Obesity Mother   . Hypertension Father   . Anxiety disorder Father   . Hyperlipidemia Father   . Breast cancer Maternal Aunt   . Breast cancer Maternal Grandmother   . Stomach cancer Paternal Grandmother   . Breast cancer Maternal Aunt     Social History Social History   Tobacco Use  . Smoking status: Never Smoker  . Smokeless tobacco: Never Used  Substance Use Topics  . Alcohol use: Not Currently    Frequency: Never    Comment: <1 per month  . Drug use: No     Allergies   Patient has no known allergies.   Review of Systems Review of Systems  All other systems reviewed and are negative.  Physical Exam Updated Vital Signs BP 140/89 (BP Location: Left Arm)   Pulse 77   Temp 97.7 F (36.5 C) (Oral)   Resp 18   Ht 1.651 m (5\' 5" )   Wt 98.4 kg   SpO2 100%   BMI 36.11 kg/m   Physical Exam Vitals signs and nursing note reviewed.  Constitutional:      General: She is not in acute distress.    Appearance: She is well-developed.  HENT:     Head: Normocephalic and atraumatic.     Right Ear: External ear normal.     Left Ear: External ear normal.  Eyes:     General: No scleral icterus.       Right eye: No discharge.        Left eye: No discharge.     Conjunctiva/sclera: Conjunctivae normal.  Neck:     Musculoskeletal: Neck supple.     Trachea: No tracheal deviation.  Cardiovascular:     Rate and Rhythm: Normal rate.  Pulmonary:     Effort: Pulmonary effort is normal. No respiratory distress.     Breath sounds: No stridor.  Abdominal:     General: There is no distension.  Musculoskeletal:        General: No swelling  or deformity.     Right wrist: Normal.     Left wrist: Normal. She exhibits normal range of motion, no bony tenderness, no swelling, no effusion, no crepitus and no deformity.     Comments: Superficial laceration 1 cm along the radial aspect of the index finger proximal phalanx, will cap refill, normal sensation, full range of motion  Skin:    General: Skin is warm and dry.     Findings: No rash.  Neurological:     Mental Status: She is alert.     Cranial Nerves: Cranial nerve deficit: no gross deficits.      ED Treatments / Results  Labs (all labs ordered are listed, but only abnormal results are displayed) Labs Reviewed - No data to display  EKG None  Radiology No results found.  Procedures Procedures (including critical care time)  Medications Ordered in ED Medications - No data to display   Initial Impression / Assessment and Plan / ED Course  I have reviewed the triage vital signs and the nursing notes.  Pertinent labs & imaging results that were available during my care of the patient were reviewed by me and considered in my medical decision making (see chart for details).  Patient's laceration occurred yesterday.  No indication for suturing.  Wound appears superficial.  Patient also complains of left wrist pain for several months.  No obvious abnormality noted on physical exam.  I discussed doing an x-ray this evening while she is here.  I did recommend that she follow-up with a sports medicine or orthopedic doctor.  Patient did not want to wait for an x-ray this evening.  She plans on following up as an outpatient.  I did recommend wearing a splint in the meantime and taking over-the-counter NSAIDs.  Final Clinical Impressions(s) / ED Diagnoses   Final diagnoses:  Laceration of right index finger without foreign body without damage to nail, initial encounter  Left wrist pain    ED Discharge Orders    None       Dorie Rank, MD 05/09/18 2212

## 2018-05-09 NOTE — ED Triage Notes (Addendum)
Laceration to right index finger with a knife while washing dishes last night. Also c/o left wrist pain since August

## 2018-05-09 NOTE — Discharge Instructions (Addendum)
Try taking ibuprofen or Naprosyn to help with your wrist pain, consider following up with a sports medicine orthopedic doctor, apply antibiotic ointment to the wound and make sure to keep a bandage covering the wound

## 2018-05-14 MED ORDER — MEDROXYPROGESTERONE ACETATE 10 MG PO TABS: 20.0000 mg | ORAL_TABLET | Freq: Every day | ORAL | 0 refills | Status: DC

## 2018-05-14 MED FILL — MEDROXYPROGESTERONE 10 MG T: 10 | 30 days supply | Qty: 60 | Fill #0

## 2018-05-18 MED ORDER — NORGESTIMATE-ETH ESTRADIOL 0.25-35 MG-MCG PO TABS: 1.0000 | ORAL_TABLET | Freq: Every day | ORAL | 11 refills | Status: DC

## 2018-05-18 MED FILL — PREVIFEM 0.25-35 MG-MCG TAB: 0.25-35 | 28 days supply | Qty: 28 | Fill #0

## 2018-08-06 ENCOUNTER — Other Ambulatory Visit: Payer: Self-pay

## 2018-08-06 ENCOUNTER — Encounter: Payer: Self-pay | Admitting: Family Medicine

## 2018-08-06 ENCOUNTER — Ambulatory Visit (INDEPENDENT_AMBULATORY_CARE_PROVIDER_SITE_OTHER): Payer: Medicaid Other | Admitting: Family Medicine

## 2018-08-06 VITALS — BP 121/69 | HR 75 | Ht 65.0 in | Wt 219.0 lb

## 2018-08-06 DIAGNOSIS — N939 Abnormal uterine and vaginal bleeding, unspecified: Secondary | ICD-10-CM | POA: Diagnosis not present

## 2018-08-06 NOTE — Progress Notes (Signed)
   Subjective:    Patient ID: Penny Klein, female    DOB: 1990-07-21, 28 y.o.   MRN: [id number]  HPI  Patient seen for questions about her IUD.  Her Liletta IUD placed on 04/02/2018.  She did have some spotting and was placed on Provera to help with spotting.  She states that this was not effective.  She stopped the Provera and the bleeding eventually went away.  She had no bleeding for a couple of months.  At the beginning of this month, the patient had 2 weeks of heavy bleeding with a lot of cramping she was saturating a pad every hour.  This went on for 2 weeks then stopped.  Additionally, the patient has been having difficulty with mood swings a couple months ago.  She does state that this has improved  Review of Systems     Objective:   Physical Exam Exam conducted with a chaperone present.  Genitourinary:    Labia:        Right: No rash, tenderness, lesion or injury.        Left: No rash, tenderness, lesion or injury.      Urethra: No urethral swelling.     Cervix: No cervical motion tenderness, discharge, friability, lesion, erythema, cervical bleeding or eversion.     Neurological:     Mental Status: She is alert.       Assessment & Plan:  1. Abnormal uterine bleeding (AUB) Discussed options with patient, including removing IUD and trying a different type of birth control versus waiting and seeing if this is a recurrent problem.  Patient did elect to keep the IUD in for now.  We will have the patient return in 2 months and reevaluate the situation at that time.

## 2018-08-11 ENCOUNTER — Ambulatory Visit: Payer: Medicaid Other | Admitting: Family Medicine

## 2018-08-11 ENCOUNTER — Other Ambulatory Visit: Payer: Self-pay

## 2018-08-11 ENCOUNTER — Encounter: Payer: Self-pay | Admitting: Family Medicine

## 2018-08-11 VITALS — BP 123/84 | HR 80 | Ht 65.0 in | Wt 219.0 lb

## 2018-08-11 DIAGNOSIS — M654 Radial styloid tenosynovitis [de Quervain]: Secondary | ICD-10-CM | POA: Diagnosis present

## 2018-08-11 NOTE — Progress Notes (Signed)
Penny Klein - 28 y.o. female MRN [id number]  Date of birth: April 03, 1990  SUBJECTIVE:  Including CC & ROS.  Chief Complaint  Patient presents with  . Wrist Pain    left wrist    Penny Klein is a 28 y.o. female that is presenting with left wrist pain.  The pain is been ongoing for about 1 year.  The pain is moderate in severity.  It is localized to the radial aspect of the wrist and thumb.  It is worse when she is performing radial deviation movements.  The pain is localized to the side of the wrist.  She denies any specific inciting event.  She works as a Occupational psychologist.  The pain can be sharp and stabbing.  It can be painful to the touch..    Review of Systems  Constitutional: Negative for fever.  HENT: Negative for congestion.   Respiratory: Negative for cough.   Cardiovascular: Negative for chest pain.  Gastrointestinal: Negative for abdominal pain.  Musculoskeletal: Negative for gait problem.  Skin: Negative for color change.  Neurological: Negative for weakness.  Hematological: Negative for adenopathy.    HISTORY: Past Medical, Surgical, Social, and Family History Reviewed & Updated per EMR.   Pertinent Historical Findings include:  Past Medical History:  Diagnosis Date  . Abnormal Pap smear of cervix 2011  . Dysmenorrhea   . Leiomyoma of uterus   . Menometrorrhagia     Past Surgical History:  Procedure Laterality Date  . LEEP      No Known Allergies  Family History  Problem Relation Age of Onset  . Lupus Paternal Uncle   . Hypertension Mother   . Hyperlipidemia Mother   . Obesity Mother   . Hypertension Father   . Anxiety disorder Father   . Hyperlipidemia Father   . Breast cancer Maternal Aunt   . Breast cancer Maternal Grandmother   . Stomach cancer Paternal Grandmother   . Breast cancer Maternal Aunt      Social History   Socioeconomic History  . Marital status: Married    Spouse name: Not on file  . Number of children: Not on file  . Years  of education: Not on file  . Highest education level: Not on file  Occupational History  . Not on file  Social Needs  . Financial resource strain: Not on file  . Food insecurity:    Worry: Not on file    Inability: Not on file  . Transportation needs:    Medical: Not on file    Non-medical: Not on file  Tobacco Use  . Smoking status: Never Smoker  . Smokeless tobacco: Never Used  Substance and Sexual Activity  . Alcohol use: Not Currently    Frequency: Never    Comment: <1 per month  . Drug use: No  . Sexual activity: Not Currently    Birth control/protection: I.U.D.  Lifestyle  . Physical activity:    Days per week: Not on file    Minutes per session: Not on file  . Stress: Not on file  Relationships  . Social connections:    Talks on phone: Not on file    Gets together: Not on file    Attends religious service: Not on file    Active member of club or organization: Not on file    Attends meetings of clubs or organizations: Not on file    Relationship status: Not on file  . Intimate partner violence:    Fear of  current or ex partner: Not on file    Emotionally abused: Not on file    Physically abused: Not on file    Forced sexual activity: Not on file  Other Topics Concern  . Not on file  Social History Narrative  . Not on file     PHYSICAL EXAM:  VS: BP 123/84   Pulse 80   Ht 5\' 5"  (1.651 m)   Wt 219 lb (99.3 kg)   BMI 36.44 kg/m  Physical Exam Gen: NAD, alert, cooperative with exam, well-appearing ENT: normal lips, normal nasal mucosa,  Eye: normal EOM, normal conjunctiva and lids CV:  no edema, +2 pedal pulses   Resp: no accessory muscle use, non-labored,  Skin: no rashes, no areas of induration  Neuro: normal tone, normal sensation to touch Psych:  normal insight, alert and oriented MSK:  Left wrist: Some swelling over the first dorsal compartment. Tenderness to palpation of the first dorsal compartment.   Normal wrist range of motion. No signs  of atrophy. Positive Finkelstein's test. Normal strength resistance with thumb flexion and hyperextension. Neurovascular intact     ASSESSMENT & PLAN:   Tennis Must Quervain's disease (tenosynovitis) Symptoms suggestive de Quervain's.  Has a 75-year-old home and could be associated with repetitive motions with picking him up. -Placed in thumb spica splint. -Counseled on home exercise therapy and supportive care. -If no improvement consider injection

## 2018-08-11 NOTE — Assessment & Plan Note (Signed)
Symptoms suggestive de Quervain's.  Has a 29-year-old home and could be associated with repetitive motions with picking him up. -Placed in thumb spica splint. -Counseled on home exercise therapy and supportive care. -If no improvement consider injection

## 2018-08-11 NOTE — Patient Instructions (Signed)
Nice to meet you Please try the thumb splint. You don't have to wear this at night.  Please try voltaren which is over the counter  Please try the exercises once the pain has improved  Please send me a message in MyChart with any questions or updates.  Please see me back in 4 weeks.   --Dr. Raeford Razor

## 2018-09-08 ENCOUNTER — Ambulatory Visit: Payer: Medicaid Other | Admitting: Family Medicine

## 2018-09-08 NOTE — Progress Notes (Deleted)
Penny Klein - 28 y.o. female MRN [id number]  Date of birth: 22-May-1990  SUBJECTIVE:  Including CC & ROS.  No chief complaint on file.   Penny Klein is a 28 y.o. female that is  ***.  ***   Review of Systems  HISTORY: Past Medical, Surgical, Social, and Family History Reviewed & Updated per EMR.   Pertinent Historical Findings include:  Past Medical History:  Diagnosis Date  . Abnormal Pap smear of cervix 2011  . Dysmenorrhea   . Leiomyoma of uterus   . Menometrorrhagia     Past Surgical History:  Procedure Laterality Date  . LEEP      No Known Allergies  Family History  Problem Relation Age of Onset  . Lupus Paternal Uncle   . Hypertension Mother   . Hyperlipidemia Mother   . Obesity Mother   . Hypertension Father   . Anxiety disorder Father   . Hyperlipidemia Father   . Breast cancer Maternal Aunt   . Breast cancer Maternal Grandmother   . Stomach cancer Paternal Grandmother   . Breast cancer Maternal Aunt      Social History   Socioeconomic History  . Marital status: Married    Spouse name: Not on file  . Number of children: Not on file  . Years of education: Not on file  . Highest education level: Not on file  Occupational History  . Not on file  Social Needs  . Financial resource strain: Not on file  . Food insecurity    Worry: Not on file    Inability: Not on file  . Transportation needs    Medical: Not on file    Non-medical: Not on file  Tobacco Use  . Smoking status: Never Smoker  . Smokeless tobacco: Never Used  Substance and Sexual Activity  . Alcohol use: Not Currently    Frequency: Never    Comment: <1 per month  . Drug use: No  . Sexual activity: Not Currently    Birth control/protection: I.U.D.  Lifestyle  . Physical activity    Days per week: Not on file    Minutes per session: Not on file  . Stress: Not on file  Relationships  . Social Herbalist on phone: Not on file    Gets together: Not on file   Attends religious service: Not on file    Active member of club or organization: Not on file    Attends meetings of clubs or organizations: Not on file    Relationship status: Not on file  . Intimate partner violence    Fear of current or ex partner: Not on file    Emotionally abused: Not on file    Physically abused: Not on file    Forced sexual activity: Not on file  Other Topics Concern  . Not on file  Social History Narrative  . Not on file     PHYSICAL EXAM:  VS: There were no vitals taken for this visit. Physical Exam Gen: NAD, alert, cooperative with exam, well-appearing ENT: normal lips, normal nasal mucosa,  Eye: normal EOM, normal conjunctiva and lids CV:  no edema, +2 pedal pulses   Resp: no accessory muscle use, non-labored,  GI: no masses or tenderness, no hernia  Skin: no rashes, no areas of induration  Neuro: normal tone, normal sensation to touch Psych:  normal insight, alert and oriented MSK:  ***      ASSESSMENT & PLAN:   No problem-specific  Assessment & Plan notes found for this encounter.

## 2018-09-09 ENCOUNTER — Other Ambulatory Visit: Payer: Self-pay

## 2018-09-09 ENCOUNTER — Ambulatory Visit: Payer: Self-pay

## 2018-09-09 ENCOUNTER — Ambulatory Visit: Payer: Medicaid Other | Admitting: Family Medicine

## 2018-09-09 ENCOUNTER — Encounter: Payer: Self-pay | Admitting: Family Medicine

## 2018-09-09 VITALS — BP 116/82 | HR 85 | Ht 65.0 in | Wt 215.0 lb

## 2018-09-09 DIAGNOSIS — M654 Radial styloid tenosynovitis [de Quervain]: Secondary | ICD-10-CM | POA: Diagnosis present

## 2018-09-09 NOTE — Progress Notes (Signed)
Medication Samples have been provided to the patient.  Drug name: Rayos     Strength: 5mg       Qty: 1 Bottle  LOT: [id number]  Exp.Date: 07/2019  Dosing instructions: take 2 pills at bedtime for 4 (four) straight days.  The patient has been instructed regarding the correct time, dose, and frequency of taking this medication, including desired effects and most common side effects.   Sherrie George, [place] 12:07 PM 09/09/2018

## 2018-09-09 NOTE — Patient Instructions (Signed)
Good to see you Please try the brace as you need to.  Please try ice  Please try the exercises  Please try the Rayos at night.   Please send me a message in MyChart with any questions or updates.  Please see me back in 4 weeks if needed.   --Dr. Raeford Razor

## 2018-09-09 NOTE — Assessment & Plan Note (Signed)
Has had improvement with the thumb spica brace. -Provided samples of Pennsaid and Rayos. -Counseled on home exercise therapy and supportive care. -Can follow-up as needed.  Could consider injection if needed.

## 2018-09-09 NOTE — Progress Notes (Signed)
Medication Samples have been provided to the patient.  Drug name:      Pennsaid Strength: 2%       Qty: 1 box  [id number]  Exp.Date: 04/2019  Dosing instructions: Use a peasize amount and rub gently.  The patient has been instructed regarding the correct time, dose, and frequency of taking this medication, including desired effects and most common side effects.   Sherrie George, [place] 12:04 PM 09/09/2018

## 2018-09-09 NOTE — Progress Notes (Signed)
Penny Klein - 28 y.o. female MRN [id number]  Date of birth: [birth year]  SUBJECTIVE:  Including CC & ROS.  Chief Complaint  Patient presents with  . Follow-up    follow up for left wrist    Penny Klein is a 28 y.o. female that is following up for left wrist de Quervain's tenosynovitis.  She is placed in a thumb spica brace has had improvement of her symptoms.  She still notices the pain when she has to do repetitive motions.  The pain is localized to the radial aspect of the distal radius.  Has some swelling in this area as well.   Review of Systems  Constitutional: Negative for fever.  HENT: Negative for congestion.   Respiratory: Negative for cough.   Cardiovascular: Negative for chest pain.  Gastrointestinal: Negative for abdominal pain.  Musculoskeletal: Negative for back pain.  Neurological: Negative for weakness.  Hematological: Negative for adenopathy.    HISTORY: Past Medical, Surgical, Social, and Family History Reviewed & Updated per EMR.   Pertinent Historical Findings include:  Past Medical History:  Diagnosis Date  . Abnormal Pap smear of cervix 2011  . Dysmenorrhea   . Leiomyoma of uterus   . Menometrorrhagia     Past Surgical History:  Procedure Laterality Date  . LEEP      No Known Allergies  Family History  Problem Relation Age of Onset  . Lupus Paternal Uncle   . Hypertension Mother   . Hyperlipidemia Mother   . Obesity Mother   . Hypertension Father   . Anxiety disorder Father   . Hyperlipidemia Father   . Breast cancer Maternal Aunt   . Breast cancer Maternal Grandmother   . Stomach cancer Paternal Grandmother   . Breast cancer Maternal Aunt      Social History   Socioeconomic History  . Marital status: Married    Spouse name: Not on file  . Number of children: Not on file  . Years of education: Not on file  . Highest education level: Not on file  Occupational History  . Not on file  Social Needs  . Financial resource  strain: Not on file  . Food insecurity    Worry: Not on file    Inability: Not on file  . Transportation needs    Medical: Not on file    Non-medical: Not on file  Tobacco Use  . Smoking status: Never Smoker  . Smokeless tobacco: Never Used  Substance and Sexual Activity  . Alcohol use: Not Currently    Frequency: Never    Comment: <1 per month  . Drug use: No  . Sexual activity: Not Currently    Birth control/protection: I.U.D.  Lifestyle  . Physical activity    Days per week: Not on file    Minutes per session: Not on file  . Stress: Not on file  Relationships  . Social Herbalist on phone: Not on file    Gets together: Not on file    Attends religious service: Not on file    Active member of club or organization: Not on file    Attends meetings of clubs or organizations: Not on file    Relationship status: Not on file  . Intimate partner violence    Fear of current or ex partner: Not on file    Emotionally abused: Not on file    Physically abused: Not on file    Forced sexual activity: Not on file  Other  Topics Concern  . Not on file  Social History Narrative  . Not on file     PHYSICAL EXAM:  VS: BP 116/82   Pulse 85   Ht 5\' 5"  (1.651 m)   Wt 215 lb (97.5 kg)   BMI 35.78 kg/m  Physical Exam Gen: NAD, alert, cooperative with exam, well-appearing ENT: normal lips, normal nasal mucosa,  Eye: normal EOM, normal conjunctiva and lids CV:  no edema, +2 pedal pulses   Resp: no accessory muscle use, non-labored,  Skin: no rashes, no areas of induration  Neuro: normal tone, normal sensation to touch Psych:  normal insight, alert and oriented MSK:  Left wrist: Tenderness palpation over the distal radius and first dorsal compartment. Normal thumb range of motion. Positive Finkelstein's test. Normal wrist range of motion. Neurovascular intact  Limited ultrasound: Left wrist:  Normal-appearing CMC joint. Effusion over the distal radius of the first  dorsal compartment.  Summary: Findings suggestive of de Quervain's tenosynovitis.  Ultrasound and interpretation by Clearance Coots, MD      ASSESSMENT & PLAN:   Harriet Pho disease (tenosynovitis) Has had improvement with the thumb spica brace. -Provided samples of Pennsaid and Rayos. -Counseled on home exercise therapy and supportive care. -Can follow-up as needed.  Could consider injection if needed.

## 2018-10-01 ENCOUNTER — Ambulatory Visit: Payer: Medicaid Other | Admitting: Family Medicine

## 2019-01-18 ENCOUNTER — Telehealth: Payer: Self-pay

## 2019-01-18 NOTE — Telephone Encounter (Signed)
Left message for patient to return call to office. Patient called after hours line this weekend. [name] RN

## 2019-01-20 ENCOUNTER — Ambulatory Visit (INDEPENDENT_AMBULATORY_CARE_PROVIDER_SITE_OTHER): Payer: Medicaid Other | Admitting: Obstetrics & Gynecology

## 2019-01-20 ENCOUNTER — Encounter: Payer: Self-pay | Admitting: Obstetrics & Gynecology

## 2019-01-20 ENCOUNTER — Other Ambulatory Visit: Payer: Self-pay

## 2019-01-20 VITALS — BP 122/68 | HR 87 | Ht 66.0 in | Wt 224.0 lb

## 2019-01-20 DIAGNOSIS — R1031 Right lower quadrant pain: Secondary | ICD-10-CM

## 2019-01-20 NOTE — Progress Notes (Signed)
History:  28 y.o. [id number] here today for eval of pain on right side. She does not recall any injury. She is worried that this could be her ovary. She is not takin gmeds for this pain. It is not severe and is not limiting ADLs.     The following portions of the patient's history were reviewed and updated as appropriate: allergies, current medications, past family history, past medical history, past social history, past surgical history and problem list.  Review of Systems:  Pertinent items are noted in HPI.    Objective:  Physical Exam Blood pressure 122/68, pulse 87, height 5\' 6"  (1.676 m), weight 224 lb (101.6 kg), last menstrual period 12/28/2018.  CONSTITUTIONAL: Well-developed, well-nourished female in no acute distress.  HENT:  Normocephalic, atraumatic EYES: Conjunctivae and EOM are normal. No scleral icterus.  NECK: Normal range of motion SKIN: Skin is warm and dry. No rash noted. Not diaphoretic.No pallor. [place]: Alert and oriented to person, place, and time. Normal coordination.  Abd: Soft, nontender and nondistended. There is mild tenderness on the right groin. No masses noted.  Pelvic: Normal appearing external genitalia; normal appearing vaginal mucosa and cervix.  Normal discharge.  Small uterus, no other palpable masses, no uterine or adnexal tenderness   Assessment & Plan:  Groin pain  NSAIDS prn  F/u if sx persist or worsen.  Total face-to-face time with patient was 15 min.  Greater than 50% was spent in counseling and coordination of care with the patient.   Suzanne Kho L. Harraway-Smith, M.D., Cherlynn June

## 2019-01-20 NOTE — Progress Notes (Signed)
Patient complaining of right side lower abdominal pain that comes and goes. [name] RN

## 2019-02-01 ENCOUNTER — Encounter: Payer: Self-pay | Admitting: Obstetrics & Gynecology

## 2019-04-26 ENCOUNTER — Ambulatory Visit (INDEPENDENT_AMBULATORY_CARE_PROVIDER_SITE_OTHER): Payer: Medicaid Other | Admitting: Obstetrics & Gynecology

## 2019-04-26 ENCOUNTER — Other Ambulatory Visit: Payer: Self-pay

## 2019-04-26 ENCOUNTER — Ambulatory Visit (HOSPITAL_BASED_OUTPATIENT_CLINIC_OR_DEPARTMENT_OTHER)
Admission: RE | Admit: 2019-04-26 | Discharge: 2019-04-26 | Disposition: A | Discharge status: Home / Self Care | Payer: Medicaid Other | Source: Ambulatory Visit | Attending: Obstetrics & Gynecology | Admitting: Obstetrics & Gynecology

## 2019-04-26 ENCOUNTER — Encounter: Payer: Self-pay | Admitting: Obstetrics & Gynecology

## 2019-04-26 VITALS — BP 124/67 | HR 80 | Ht 65.0 in | Wt 217.0 lb

## 2019-04-26 DIAGNOSIS — Z3009 Encounter for other general counseling and advice on contraception: Secondary | ICD-10-CM

## 2019-04-26 DIAGNOSIS — N921 Excessive and frequent menstruation with irregular cycle: Secondary | ICD-10-CM | POA: Insufficient documentation

## 2019-04-26 MED ORDER — NORETHINDRONE ACET-ETHINYL EST 1.5-30 MG-MCG PO TABS: 1.0000 | ORAL_TABLET | Freq: Every day | ORAL | 11 refills | Status: DC

## 2019-04-26 MED FILL — JUNEL 1.5-30 TABLET: 1.5-30 | 21 days supply | Qty: 21 | Fill #0

## 2019-04-26 NOTE — Patient Instructions (Signed)
Oral Contraception Use Oral contraceptive pills (OCPs) are medicines that you take to prevent pregnancy. OCPs work by:  Preventing the ovaries from releasing eggs.  Thickening mucus in the lower part of the uterus (cervix), which prevents sperm from entering the uterus.  Thinning the lining of the uterus (endometrium), which prevents a fertilized egg from attaching to the endometrium. OCPs are highly effective when taken exactly as prescribed. However, OCPs do not prevent sexually transmitted infections (STIs). Safe sex practices, such as using condoms while on an OCP, can help prevent STIs. Before taking OCPs, you may have a physical exam, blood test, and Pap test. A Pap test involves taking a sample of cells from your cervix to check for cancer. Discuss with your health care provider the possible side effects of the OCP you may be prescribed. When you start an OCP, be aware that it can take 2-3 months for your body to adjust to changes in hormone levels. How to take oral contraceptive pills Follow instructions from your health care provider about how to start taking your first cycle of OCPs. Your health care provider may recommend that you:  Start the pill on day 1 of your menstrual period. If you start at this time, you will not need any backup form of birth control (contraception), such as condoms.  Start the pill on the first Sunday after your menstrual period or on the day you get your prescription. In these cases, you will need to use backup contraception for the first week.  Start the pill at any time of your cycle. ? If you take the pill within 5 days of the start of your period, you will not need a backup form of contraception. ? If you start at any other time of your menstrual cycle, you will need to use another form of contraception for 7 days. If your OCP is the type called a minipill, it will protect you from pregnancy after taking it for 2 days (48 hours), and you can stop using  backup contraception after that time. After you have started taking OCPs:  If you forget to take 1 pill, take it as soon as you remember. Take the next pill at the regular time.  If you miss 2 or more pills, call your health care provider. Different pills have different instructions for missed doses. Use backup birth control until your next menstrual period starts.  If you use a 28-day pack that contains inactive pills and you miss 1 of the last 7 pills (pills with no hormones), throw away the rest of the non-hormone pills and start a new pill pack. No matter which day you start the OCP, you will always start a new pack on that same day of the week. Have an extra pack of OCPs and a backup contraceptive method available in case you miss some pills or lose your OCP pack. Follow these instructions at home:  Do not use any products that contain nicotine or tobacco, such as cigarettes and e-cigarettes. If you need help quitting, ask your health care provider.  Always use a condom to protect against STIs. OCPs do not protect against STIs.  Use a calendar to mark the days of your menstrual period.  Read the information and directions that came with your OCP. Talk to your health care provider if you have questions. Contact a health care provider if:  You develop nausea and vomiting.  You have abnormal vaginal discharge or bleeding.  You develop a rash.    You miss your menstrual period. Depending on the type of OCP you are taking, this may be a sign of pregnancy. Ask your health care provider for more information.  You are losing your hair.  You need treatment for mood swings or depression.  You get dizzy when taking the OCP.  You develop acne after taking the OCP.  You become pregnant or think you may be pregnant.  You have diarrhea, constipation, and abdominal pain or cramps.  You miss 2 or more pills. Get help right away if:  You develop chest pain.  You develop shortness of  breath.  You have an uncontrolled or severe headache.  You develop numbness or slurred speech.  You develop visual or speech problems.  You develop pain, redness, and swelling in your legs.  You develop weakness or numbness in your arms or legs. Summary  Oral contraceptive pills (OCPs) are medicines that you take to prevent pregnancy.  OCPs do not prevent sexually transmitted infections (STIs). Always use a condom to protect against STIs.  When you start an OCP, be aware that it can take 2-3 months for your body to adjust to changes in hormone levels.  Read all the information and directions that come with your OCP. This information is not intended to replace advice given to you by your health care provider. Make sure you discuss any questions you have with your health care provider. Document Revised: 06/19/2018 Document Reviewed: 04/08/2016 Elsevier Patient Education  2020 [employer]  

## 2019-04-26 NOTE — Progress Notes (Signed)
History:  29 y.o. [id number] here today for today for IUD string check; Liletta  IUD was placed  04/02/2018. Pt reports that she has had bleeding with heavy clots and pain for the last several months and she would like to have the IUD removed. She reports that she initially reports that her bleeding was lighter however, it has become heavier with large clots. She does report heavy menses prior to the IUD which initially improved but, now is worse. Pt would like to have the IUD removed then f/u for furhter eval and possible replacement of the IUD.  Pt also reports that she has pain that is bilateral. This is worse with intercourse.   The following portions of the patient's history were reviewed and updated as appropriate: allergies, current medications, past family history, past medical history, past social history, past surgical history and problem list.  Review of Systems:  Pertinent items are noted in HPI.    Objective:  Physical Exam Blood pressure 124/67, pulse 80, height 5\' 5"  (1.651 m), weight 217 lb 0.6 oz (98.4 kg), last menstrual period 04/12/2019.  CONSTITUTIONAL: Well-developed, well-nourished female in no acute distress.  HENT:  Normocephalic, atraumatic EYES: Conjunctivae and EOM are normal. No scleral icterus.  NECK: Normal range of motion SKIN: Skin is warm and dry. No rash noted. Not diaphoretic.No pallor. [place]: Alert and oriented to person, place, and time. Normal coordination.  Pelvic Patient was in the dorsal lithotomy position, normal external genitalia was noted.  A speculum was placed in the patient's vagina, normal discharge was noted, no lesions. The multiparous cervix was visualized, no lesions, no abnormal discharge;  and the cervix was swabbed with Betadine using scopettes. The strings of the IUD were grasped and pulled using ring forceps.  The IUD was successfully removed in its entirety.  Patient tolerated the procedure well.      Assessment & Plan:   Complications with IUD  IUD removed The use of the oral contraceptive has been fully discussed with the patient. This includes the proper method to initiate (i.e. Sunday start after next normal menstrual onset versus same day start) and continue the pills, the need for regular compliance to ensure adequate contraceptive effect, the physiology which make the pill effective, the instructions for what to do in event of a missed pill, and warnings about anticipated minor side effects such as breakthrough spotting, nausea, breast tenderness, weight changes, acne, headaches, etc.  She has been told of the more serious potential side effects such as MI, stroke, and deep vein thrombosis, all of which are very unlikely.  She has been asked to report any signs of such serious problems immediately.  She should back up the pill with a condom during any cycle in which antibiotics are prescribed, and during the first cycle as well. The need for additional protection, such as a condom, to prevent exposure to sexually transmitted diseases has also been discussed- the patient has been clearly reminded that OCP's cannot protect her against diseases such as HIV and others. She understands and wishes to take the medication as prescribed.She was given a prescription for LoEstrin 1.5/30 1 po q day and will return in 3 months for BP and contraceptive check.  Pt may elect to have the IUD replaced at that time.  Will order pelvic US to eval AUB   Total face-to-face time with patient was 18 min.  Greater than 50% was spent in counseling and coordination of care with the patient.   Ngoc Detjen L.  Harraway-Smith, M.D., Cherlynn June

## 2019-04-28 ENCOUNTER — Telehealth: Payer: Self-pay | Admitting: Obstetrics & Gynecology

## 2019-04-28 NOTE — Telephone Encounter (Signed)
Returned call to pt to review [name]. Left message on home number. The voicemail on the mobile number was full.   Reagen Goates L. Harraway-Smith, M.D., Cherlynn June

## 2019-05-03 ENCOUNTER — Telehealth: Payer: Self-pay | Admitting: Obstetrics & Gynecology

## 2019-05-03 ENCOUNTER — Telehealth: Payer: Self-pay

## 2019-05-03 DIAGNOSIS — D251 Intramural leiomyoma of uterus: Secondary | ICD-10-CM

## 2019-05-03 DIAGNOSIS — N920 Excessive and frequent menstruation with regular cycle: Secondary | ICD-10-CM

## 2019-05-03 MED ORDER — TRANEXAMIC ACID 650 MG PO TABS: 1300.0000 mg | ORAL_TABLET | Freq: Three times a day (TID) | ORAL | 2 refills | Status: DC

## 2019-05-03 NOTE — Telephone Encounter (Signed)
TC to pt. She wanted results of her [name]. She reports heavy bleeding.  Her IUD was removed at her last visit.  I reviewed her management options. She would like to avoid surgery. She is interested in [place]. I reviewed usage. Pt to begin Lysteda 1500mg  tid x 5 days max. She will f/u in 3 months or sooner prn.  Telvin Reinders L. Harraway-Smith, M.D., Cherlynn June

## 2019-05-03 NOTE — Telephone Encounter (Signed)
Pt called the office back to discuss [name] results with the provider. Pt asks [name] to have provider call her at 11:30am.  Will send message to provider.  Tarnesha Ulloa l Addley Ballinger, CMA

## 2019-05-05 ENCOUNTER — Ambulatory Visit (HOSPITAL_COMMUNITY)
Admission: EM | Admit: 2019-05-05 | Discharge: 2019-05-05 | Disposition: A | Discharge status: Home / Self Care | Payer: Medicaid Other | Attending: Emergency Medicine | Admitting: Emergency Medicine

## 2019-05-05 ENCOUNTER — Encounter (HOSPITAL_COMMUNITY): Payer: Self-pay

## 2019-05-05 ENCOUNTER — Other Ambulatory Visit: Payer: Self-pay

## 2019-05-05 DIAGNOSIS — S39012A Strain of muscle, fascia and tendon of lower back, initial encounter: Secondary | ICD-10-CM | POA: Diagnosis not present

## 2019-05-05 DIAGNOSIS — S29019A Strain of muscle and tendon of unspecified wall of thorax, initial encounter: Secondary | ICD-10-CM

## 2019-05-05 DIAGNOSIS — S40022A Contusion of left upper arm, initial encounter: Secondary | ICD-10-CM | POA: Diagnosis not present

## 2019-05-05 DIAGNOSIS — S161XXA Strain of muscle, fascia and tendon at neck level, initial encounter: Secondary | ICD-10-CM

## 2019-05-05 MED ORDER — IBUPROFEN 600 MG PO TABS: 600.0000 mg | ORAL_TABLET | Freq: Four times a day (QID) | ORAL | 0 refills | Status: DC | PRN

## 2019-05-05 MED ORDER — TIZANIDINE HCL 4 MG PO TABS: 4.0000 mg | ORAL_TABLET | Freq: Three times a day (TID) | ORAL | 0 refills | Status: DC | PRN

## 2019-05-05 MED ORDER — HYDROCODONE-ACETAMINOPHEN 5-325 MG PO TABS: 1.0000 | ORAL_TABLET | Freq: Four times a day (QID) | ORAL | 0 refills | Status: DC | PRN
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
## 2019-05-05 NOTE — Discharge Instructions (Addendum)
People tend to feel worse over the next several days, but most people are back to normal in 1 week. A small number of people will have persistent pain for up to six weeks. Take the 600 mg of ibuprofen with a Tylenol containing product 3 or 4 times a day.  Either ibuprofen 1000 mg of Tylenol for mild to moderate pain or ibuprofen with 1-2 Norco for severe pain. Do not take the norco if you are taking the tylenol, as they both have tylenol in them, and too much can hurt your liver. Do not exceed 4 grams of tylenol per day from all sources.   Zanaflex for muscle spasms.  Deep tissue massage, warm baths may help.  Go to www.goodrx.com to look up your medications. This will give you a list of where you can find your prescriptions at the most affordable prices. Or ask the pharmacist what the cash price is, or if they have any other discount programs available to help make your medication more affordable. This can be less expensive than what you would pay with insurance.

## 2019-05-05 NOTE — ED Triage Notes (Signed)
Patient on phone during discharge instructions

## 2019-05-06 MED FILL — IBUPROFEN 600 MG TABLET: 600 | 7 days supply | Qty: 30 | Fill #0

## 2019-05-06 MED FILL — tiZANidine HCL 4 MG TABS: 4 | 10 days supply | Qty: 30 | Fill #0

## 2019-05-06 MED FILL — HYDROCODON-APAP 5-325: 5-325 | 2 days supply | Qty: 12 | Fill #0

## 2019-05-07 ENCOUNTER — Other Ambulatory Visit: Payer: Self-pay

## 2019-05-07 ENCOUNTER — Ambulatory Visit (INDEPENDENT_AMBULATORY_CARE_PROVIDER_SITE_OTHER): Payer: Medicaid Other

## 2019-05-07 ENCOUNTER — Ambulatory Visit (HOSPITAL_COMMUNITY)
Admission: EM | Admit: 2019-05-07 | Discharge: 2019-05-07 | Disposition: A | Discharge status: Home / Self Care | Payer: Medicaid Other | Attending: Family Medicine | Admitting: Family Medicine

## 2019-05-07 ENCOUNTER — Encounter (HOSPITAL_COMMUNITY): Payer: Self-pay

## 2019-05-07 DIAGNOSIS — S161XXA Strain of muscle, fascia and tendon at neck level, initial encounter: Secondary | ICD-10-CM

## 2019-05-07 MED ORDER — KETOROLAC TROMETHAMINE 60 MG/2ML IM SOLN
INTRAMUSCULAR | Status: AC
  Filled 2019-05-07: qty 2

## 2019-05-07 MED ORDER — KETOROLAC TROMETHAMINE 60 MG/2ML IM SOLN
60.0000 mg | Freq: Once | INTRAMUSCULAR | Status: AC
  Administered 2019-05-07: 60 mg via INTRAMUSCULAR

## 2019-05-07 MED ORDER — CYCLOBENZAPRINE HCL 10 MG PO TABS: 10.0000 mg | ORAL_TABLET | Freq: Two times a day (BID) | ORAL | 0 refills | Status: DC | PRN

## 2019-05-07 MED FILL — CYCLOBENZAPRINE HCL 10 MG T: 10 | 10 days supply | Qty: 20 | Fill #0

## 2019-05-07 NOTE — Discharge Instructions (Addendum)
Your x-rays were normal We will switch to Flexeril for muscle aches and see if you get better relief with this Toradol given here for pain Alternate heat and ice to the area. You can use the pain medicine already prescribed as needed Continue the ibuprofen as needed
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
## 2019-05-10 ENCOUNTER — Encounter: Payer: Self-pay | Admitting: Physician Assistant
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
## 2019-05-11 ENCOUNTER — Other Ambulatory Visit: Payer: Self-pay

## 2019-05-11 ENCOUNTER — Encounter: Payer: Self-pay | Admitting: Nurse Practitioner

## 2019-05-11 ENCOUNTER — Ambulatory Visit (INDEPENDENT_AMBULATORY_CARE_PROVIDER_SITE_OTHER): Payer: Medicaid Other | Admitting: Nurse Practitioner

## 2019-05-11 VITALS — BP 128/74 | HR 76 | Temp 97.9°F | Ht 65.0 in | Wt 213.0 lb

## 2019-05-11 DIAGNOSIS — R1012 Left upper quadrant pain: Secondary | ICD-10-CM | POA: Diagnosis not present

## 2019-05-11 DIAGNOSIS — R110 Nausea: Secondary | ICD-10-CM | POA: Diagnosis not present

## 2019-05-11 DIAGNOSIS — R109 Unspecified abdominal pain: Secondary | ICD-10-CM | POA: Diagnosis not present

## 2019-05-11 MED ORDER — ONDANSETRON HCL 4 MG PO TABS: 4.0000 mg | ORAL_TABLET | Freq: Three times a day (TID) | ORAL | 0 refills | Status: DC | PRN

## 2019-05-11 MED ORDER — OMEPRAZOLE 20 MG PO CPDR: 20.0000 mg | DELAYED_RELEASE_CAPSULE | Freq: Every day | ORAL | 0 refills | Status: DC

## 2019-05-11 MED FILL — OMEPRAZOLE 20 MG CAP: 20 | 30 days supply | Qty: 30 | Fill #0

## 2019-05-11 MED FILL — ONDANSETRON HCL 4 MG TABLET: 4 | 6 days supply | Qty: 20 | Fill #0
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
## 2019-05-11 NOTE — Patient Instructions (Addendum)
If you are age 30 or older, your body mass index should be between 23-30. Your Body mass index is 35.45 kg/m. If this is out of the aforementioned range listed, please consider follow up with your Primary Care Provider.  If you are age 87 or younger, your body mass index should be between 19-25. Your Body mass index is 35.45 kg/m. If this is out of the aformentioned range listed, please consider follow up with your Primary Care Provider.   Your provider has requested that you have lab work today. We ask that you go to our [facility] office at [address] [place], [place] [address]. Please enter through the main entrance and go to the elevator.  Press "B" on the elevator. The lab is located at the first door on the left as you exit the elevator.   You are scheduled for a CT at [facility] on 05/17/2019 at 2:00pm. You should arrive 15 minutes prior to your appointment time for registration. Please follow the written instructions below on the day of your exam:  WARNING: IF YOU ARE ALLERGIC TO IODINE/X-RAY DYE, PLEASE NOTIFY RADIOLOGY IMMEDIATELY AT [phone]! YOU WILL BE GIVEN A 13 HOUR PREMEDICATION PREP.  1) Do not eat or drink anything after 10:00am (4 hours prior to your test) 2) You have been given 2 bottles of oral contrast to drink. The solution may taste better if refrigerated, but do NOT add ice or any other liquid to this solution. Shake well before drinking.    Drink 1 bottle of contrast @ 12:00 pm (2 hours prior to your exam)  Drink 1 bottle of contrast @ 1:00 pm (1 hour prior to your exam)  You may take any medications as prescribed with a small amount of water, if necessary. If you take any of the following medications: METFORMIN, GLUCOPHAGE, GLUCOVANCE, AVANDAMET, RIOMET, FORTAMET, [place] MET, JANUMET, GLUMETZA or METAGLIP, you MAY be asked to HOLD this medication 48 hours AFTER the exam.  The purpose of you drinking the oral contrast is to aid in the  visualization of your intestinal tract. The contrast solution may cause some diarrhea. Depending on your individual set of symptoms, you may also receive an intravenous injection of x-ray contrast/dye. Plan on being at [facility] for 30 minutes or longer, depending on the type of exam you are having performed.  This test typically takes 30-45 minutes to complete.    We have sent the following medications to your pharmacy for you to pick up at your convenience: Omeprazole 20 mg Ondansetron 4 mg  Follow up with your Primary Care Physician for musculoskeletal back pain.  Due to recent changes in healthcare laws, you may see the results of your imaging and laboratory studies on MyChart before your provider has had a chance to review them.  We understand that in some cases there may be results that are confusing or concerning to you. Not all laboratory results come back in the same time frame and the provider may be waiting for multiple results in order to interpret others.  Please give [name] 48 hours in order for your provider to thoroughly review all the results before contacting the office for clarification of your results.   Thank you for choosing [facility] Noralyn Pick, CRNP

## 2019-05-12 DIAGNOSIS — R1012 Left upper quadrant pain: Secondary | ICD-10-CM | POA: Insufficient documentation

## 2019-05-12 DIAGNOSIS — R109 Unspecified abdominal pain: Secondary | ICD-10-CM | POA: Insufficient documentation

## 2019-05-13 ENCOUNTER — Other Ambulatory Visit (INDEPENDENT_AMBULATORY_CARE_PROVIDER_SITE_OTHER): Payer: Medicaid Other

## 2019-05-13 DIAGNOSIS — R110 Nausea: Secondary | ICD-10-CM

## 2019-05-13 DIAGNOSIS — R1012 Left upper quadrant pain: Secondary | ICD-10-CM | POA: Diagnosis not present

## 2019-05-13 LAB — CBC WITH DIFFERENTIAL/PLATELET
Basophils Absolute: 0 10*3/uL (ref 0.0–0.1)
Basophils Relative: 0.4 % (ref 0.0–3.0)
Eosinophils Absolute: 0.1 10*3/uL (ref 0.0–0.7)
Eosinophils Relative: 1.7 % (ref 0.0–5.0)
HCT: 41.8 % (ref 36.0–46.0)
Hemoglobin: 14.1 g/dL (ref 12.0–15.0)
Lymphocytes Relative: 28 % (ref 12.0–46.0)
Lymphs Abs: 1.9 10*3/uL (ref 0.7–4.0)
MCHC: 33.7 g/dL (ref 30.0–36.0)
MCV: 88.7 fl (ref 78.0–100.0)
Monocytes Absolute: 0.3 10*3/uL (ref 0.1–1.0)
Monocytes Relative: 3.7 % (ref 3.0–12.0)
Neutro Abs: 4.5 10*3/uL (ref 1.4–7.7)
Neutrophils Relative %: 66.2 % (ref 43.0–77.0)
Platelets: 301 10*3/uL (ref 150.0–400.0)
RBC: 4.71 Mil/uL (ref 3.87–5.11)
RDW: 13.9 % (ref 11.5–15.5)
WBC: 6.8 10*3/uL (ref 4.0–10.5)

## 2019-05-13 LAB — COMPREHENSIVE METABOLIC PANEL
ALT: 15 U/L (ref 0–35)
AST: 17 U/L (ref 0–37)
Albumin: 4.6 g/dL (ref 3.5–5.2)
Alkaline Phosphatase: 66 U/L (ref 39–117)
BUN: 13 mg/dL (ref 6–23)
CO2: 28 mEq/L (ref 19–32)
Calcium: 9.6 mg/dL (ref 8.4–10.5)
Chloride: 103 mEq/L (ref 96–112)
Creatinine, Ser: 0.72 mg/dL (ref 0.40–1.20)
GFR: 115.32 mL/min (ref >60.00)
Glucose, Bld: 100 mg/dL — ABNORMAL HIGH (ref 70–99)
Potassium: 4.1 mEq/L (ref 3.5–5.1)
Sodium: 138 mEq/L (ref 135–145)
Total Bilirubin: 0.6 mg/dL (ref 0.2–1.2)
Total Protein: 8.1 g/dL (ref 6.0–8.3)

## 2019-05-13 LAB — C-REACTIVE PROTEIN: CRP: <1 mg/dL (ref 0.5–20.0)

## 2019-05-13 NOTE — Progress Notes (Signed)
Agree with the assessment and plan as outlined by Colleen Kennedy-Smith, NP.   

## 2019-05-17 ENCOUNTER — Other Ambulatory Visit: Payer: Self-pay

## 2019-05-17 ENCOUNTER — Ambulatory Visit: Payer: Medicaid Other | Admitting: Physician Assistant

## 2019-05-17 ENCOUNTER — Ambulatory Visit (HOSPITAL_COMMUNITY)
Admission: RE | Admit: 2019-05-17 | Discharge: 2019-05-17 | Disposition: A | Discharge status: Home / Self Care | Payer: 59 | Source: Ambulatory Visit | Attending: Nurse Practitioner | Admitting: Nurse Practitioner

## 2019-05-17 DIAGNOSIS — Y929 Unspecified place or not applicable: Secondary | ICD-10-CM | POA: Insufficient documentation

## 2019-05-17 DIAGNOSIS — Y939 Activity, unspecified: Secondary | ICD-10-CM | POA: Diagnosis not present

## 2019-05-17 DIAGNOSIS — Y999 Unspecified external cause status: Secondary | ICD-10-CM | POA: Insufficient documentation

## 2019-05-17 DIAGNOSIS — R1012 Left upper quadrant pain: Secondary | ICD-10-CM

## 2019-05-17 DIAGNOSIS — R110 Nausea: Secondary | ICD-10-CM | POA: Diagnosis not present

## 2019-05-17 DIAGNOSIS — K429 Umbilical hernia without obstruction or gangrene: Secondary | ICD-10-CM | POA: Diagnosis not present

## 2019-05-17 DIAGNOSIS — K5900 Constipation, unspecified: Secondary | ICD-10-CM | POA: Diagnosis not present

## 2019-05-17 MED ORDER — SODIUM CHLORIDE (PF) 0.9 % IJ SOLN
INTRAMUSCULAR | Status: AC
  Filled 2019-05-17: qty 50

## 2019-05-17 MED ORDER — IOHEXOL 300 MG/ML  SOLN
100.0000 mL | Freq: Once | INTRAMUSCULAR | Status: AC | PRN
  Administered 2019-05-17: 100 mL via INTRAVENOUS

## 2019-05-19 ENCOUNTER — Telehealth: Payer: Self-pay | Admitting: Nurse Practitioner

## 2019-05-19 NOTE — Telephone Encounter (Signed)
Called and spoke with patient-patient given information from patient message concerning Ct results; Patient advised to call back to the office at [phone] should questions/concerns arise;  Patient verbalized understanding of information/instructions;

## 2019-05-19 NOTE — Telephone Encounter (Signed)
Patient is calling has questions on CT results

## 2019-05-20 NOTE — Telephone Encounter (Signed)
Reply sent to pt. See CTAP notes.

## 2019-05-21 MED FILL — OXYBUTYNIN CHLORIDE 5 MG TA: 5 | 10 days supply | Qty: 30 | Fill #0

## 2019-05-21 MED FILL — AMITRIPTYLINE HCL 10 MG TAB: 10 | 30 days supply | Qty: 30 | Fill #0

## 2019-05-21 MED FILL — BUTALBITAL-APAP-CAFFEINE 50: 50-325-40 | 20 days supply | Qty: 60 | Fill #0

## 2019-05-25 NOTE — Telephone Encounter (Signed)
Referral sent to [facility] at this time; awaiting response;

## 2019-05-26 NOTE — Telephone Encounter (Signed)
Notification has been received that patient has been scheduled on 06/17/2019 at 10:00 am with Dr. Bobbye Morton;

## 2019-06-04 ENCOUNTER — Ambulatory Visit: Payer: Medicaid Other | Attending: Internal Medicine

## 2019-06-04 DIAGNOSIS — Z23 Encounter for immunization: Secondary | ICD-10-CM

## 2019-06-04 NOTE — Progress Notes (Signed)
   Covid-19 Vaccination Clinic  Name:  Penny Klein    MRN: [id number] DOB: [birth year]  06/04/2019  Penny Klein was observed post Covid-19 immunization for 15 minutes without incident. She was provided with Vaccine Information Sheet and instruction to access the V-Safe system.   Penny Klein was instructed to call 911 with any severe reactions post vaccine: Marland Kitchen Difficulty breathing  . Swelling of face and throat  . A fast heartbeat  . A bad rash all over body  . Dizziness and weakness   Immunizations Administered    Name Date Dose VIS Date Route   Moderna COVID-19 Vaccine 06/04/2019 11:03 AM 0.5 mL 02/09/2019 Intramuscular   Manufacturer: Moderna   Lot: [id number]   [id number]

## 2019-06-18 MED FILL — BUTALBITAL-APAP-CAFFEINE 50: 50-325-40 | 20 days supply | Qty: 60 | Fill #0

## 2019-06-18 MED FILL — AMITRIPTYLINE HCL 10 MG TAB: 10 | 30 days supply | Qty: 30 | Fill #0

## 2019-06-23 ENCOUNTER — Telehealth: Payer: Self-pay

## 2019-06-23 NOTE — Telephone Encounter (Signed)
Patient called and left message that she should keep her April 30th appointment with Dr. Ihor Dow. [name] RN
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
## 2019-06-23 NOTE — Telephone Encounter (Signed)
-----   Message from Lavonia Drafts, MD sent at 06/23/2019  3:18 PM EDT ----- Re TC. This can be discuss in an OV either in person or via webex.   Please let her know that I spoke to Dr. Bobbye Morton and can review with her. Please assist with appt in the next 2 weeks.   Thx,    Clh-S

## 2019-07-02 MED FILL — BUTALBITAL-APAP-CAFFEINE 50: 50-325-40 | 20 days supply | Qty: 60 | Fill #0

## 2019-07-02 MED FILL — AMITRIPTYLINE HCL 10 MG TAB: 10 | 30 days supply | Qty: 30 | Fill #0

## 2019-07-06 ENCOUNTER — Ambulatory Visit: Payer: Medicaid Other | Attending: Internal Medicine

## 2019-07-06 DIAGNOSIS — Z23 Encounter for immunization: Secondary | ICD-10-CM

## 2019-07-06 NOTE — Progress Notes (Signed)
   Covid-19 Vaccination Clinic  Name:  Rhylee Sneddon    MRN: [id number] DOB: Jul 25, 1990  07/06/2019  Ms. Atienza was observed post Covid-19 immunization for 15 minutes without incident. She was provided with Vaccine Information Sheet and instruction to access the V-Safe system.   Ms. Lackman was instructed to call 911 with any severe reactions post vaccine: Marland Kitchen Difficulty breathing  . Swelling of face and throat  . A fast heartbeat  . A bad rash all over body  . Dizziness and weakness   Immunizations Administered    Name Date Dose VIS Date Route   Moderna COVID-19 Vaccine 07/06/2019 12:17 PM 0.5 mL 02/2019 Intramuscular   Manufacturer: Moderna   Lot: [id number]   [id number]

## 2019-07-09 ENCOUNTER — Encounter: Payer: Self-pay | Admitting: Obstetrics & Gynecology

## 2019-07-09 ENCOUNTER — Ambulatory Visit (INDEPENDENT_AMBULATORY_CARE_PROVIDER_SITE_OTHER): Payer: 59 | Admitting: Obstetrics & Gynecology

## 2019-07-09 ENCOUNTER — Other Ambulatory Visit: Payer: Self-pay

## 2019-07-09 VITALS — BP 133/80 | HR 73 | Ht 65.0 in | Wt 210.0 lb

## 2019-07-09 DIAGNOSIS — N939 Abnormal uterine and vaginal bleeding, unspecified: Secondary | ICD-10-CM

## 2019-07-09 DIAGNOSIS — K420 Umbilical hernia with obstruction, without gangrene: Secondary | ICD-10-CM

## 2019-07-09 NOTE — Progress Notes (Signed)
Patient is currently being treated for hernia repair and will be having surgery soon.  Patient states that periods are back to being heavy and clotting. Patient states she does like she doesn't have the vaginal irritation that she did with the IUD [name] RN

## 2019-07-09 NOTE — Progress Notes (Signed)
History:  29 y.o. [id number] here today for eval of AUB. Pt took the Lysteda for 1 month and noted a slightly decreased amount of bleeding.  She had only had one cycle since starting the Lysteda. She was seen by General surgery, Dr. Bobbye Morton for eval for her umbilical hernia. She is undecided on surgery for now. She denies clinical changes since her last visit.     The following portions of the patient's history were reviewed and updated as appropriate: allergies, current medications, past family history, past medical history, past social history, past surgical history and problem list.  Review of Systems:  Pertinent items are noted in HPI.    Objective:  Physical Exam Blood pressure 133/80, pulse 73, height 5\' 5"  (1.651 m), weight 210 lb (95.3 kg), last menstrual period 07/02/2019.  CONSTITUTIONAL: Well-developed, well-nourished female in no acute distress.  HENT:  Normocephalic, atraumatic EYES: Conjunctivae and EOM are normal. No scleral icterus.  NECK: Normal range of motion SKIN: Skin is warm and dry. No rash noted. Not diaphoretic.No pallor. [place]: Alert and oriented to person, place, and time. Normal coordination.  Abd: Soft, nontender and nondistended; there is a small [id number] cm umbilical hernia.    Pelvic: not repeated.    Labs and Imaging 04/26/2019 CLINICAL DATA:  Pain. Pain with intrauterine device. Intrauterine device removed. LMP 04/26/2019.  EXAM: TRANSVAGINAL ULTRASOUND OF PELVIS  DOPPLER ULTRASOUND OF OVARIES  TECHNIQUE: Transvaginal ultrasound examination of the pelvis was performed including evaluation of the uterus, ovaries, adnexal regions, and pelvic cul-de-sac.  Color and duplex Doppler ultrasound was utilized to evaluate blood flow to the ovaries.  COMPARISON:  For 119 OB ultrasound  FINDINGS: Uterus  Measurements: 9.9 x 6.8 x 6.3 centimeter = volume: 224.3 mL. Solid partially calcified circumscribed mass is identified along the anterior  fundal region, measuring 5.4 x 5.5 x 5.4 centimeters and consistent with fibroid.  Endometrium  Thickness: 9.4 millimeter.  Normal in appearance.  Right ovary  Measurements: 2.8 x 1.7 x 1.7 centimeter = volume: 4.4 mL. Normal appearance/no adnexal mass.  Left ovary  Measurements: 2.9 x 1.8 x 1.7 centimeter = volume: 4.8 mL. Normal appearance/no adnexal mass.  Pulsed Doppler evaluation demonstrates normal low-resistance arterial and venous waveforms in both ovaries.  IMPRESSION: 1. 5.5 centimeter calcified fundal fibroid. 2. Endometrium normal thickness. 3. Normal appearance of both ovaries. 4. No free pelvic fluid.   Assessment & Plan:  AUB-  Keep Lysteda tid prn menorrhagia  Pt will f/u in 3 months for further eval    Pt is undecided about her future fertility and after review of her options for management of AUB she would like to  consider these options further.    Umbilical hernia  She will f/u with Dr. Bobbye Morton  I have informed her that it totally depends on what treatment option she decide on whether this can be done in  conjunction with a hernia repair. She is not currently symptomatic and will wait on surgery until she decides on a long term plan.      Total face-to-face time with patient was 20 min.  Greater than 50% was spent in counseling and coordination of care with the patient.   Adonus Uselman L. Harraway-Smith, M.D., Cherlynn June

## 2019-07-16 ENCOUNTER — Encounter: Payer: Self-pay | Admitting: Obstetrics & Gynecology

## 2019-07-28 MED FILL — TRANEXAMIC ACID 650 MG TAB: 650 | 5 days supply | Qty: 30 | Fill #0

## 2019-09-24 ENCOUNTER — Ambulatory Visit (INDEPENDENT_AMBULATORY_CARE_PROVIDER_SITE_OTHER): Payer: 59 | Admitting: Obstetrics & Gynecology

## 2019-09-24 ENCOUNTER — Other Ambulatory Visit: Payer: Self-pay

## 2019-09-24 ENCOUNTER — Encounter: Payer: Self-pay | Admitting: Obstetrics & Gynecology

## 2019-09-24 VITALS — BP 115/81 | HR 74 | Wt 211.0 lb

## 2019-09-24 DIAGNOSIS — D219 Benign neoplasm of connective and other soft tissue, unspecified: Secondary | ICD-10-CM

## 2019-09-24 DIAGNOSIS — N939 Abnormal uterine and vaginal bleeding, unspecified: Secondary | ICD-10-CM

## 2019-09-24 DIAGNOSIS — D259 Leiomyoma of uterus, unspecified: Secondary | ICD-10-CM | POA: Diagnosis not present

## 2019-09-24 MED ORDER — MEGESTROL ACETATE 40 MG PO TABS: 40.0000 mg | ORAL_TABLET | Freq: Two times a day (BID) | ORAL | 5 refills | Status: DC

## 2019-09-24 MED FILL — MEGESTROL 40 MG TABLET: 40 | 15 days supply | Qty: 60 | Fill #0

## 2019-09-24 NOTE — Patient Instructions (Signed)
Uterine Fibroids  Uterine fibroids (leiomyomas) are noncancerous (benign) tumors that can develop in the uterus. Fibroids may also develop in the fallopian tubes, cervix, or tissues (ligaments) near the uterus. You may have one or many fibroids. Fibroids vary in size, weight, and where they grow in the uterus. Some can become quite large. Most fibroids do not require medical treatment. What are the causes? The cause of this condition is not known. What increases the risk? You are more likely to develop this condition if you:  Are in your 30s or 40s and have not gone through menopause.  Have a family history of this condition.  Are of African-American descent.  Had your first period at an early age (early menarche).  Have not had any children (nulliparity).  Are overweight or obese. What are the signs or symptoms? Many women do not have any symptoms. Symptoms of this condition may include:  Heavy menstrual bleeding.  Bleeding or spotting between periods.  Pain and pressure in the pelvic area, between the hips.  Bladder problems, such as needing to urinate urgently or more often than usual.  Inability to have children (infertility).  Failure to carry pregnancy to term (miscarriage). How is this diagnosed? This condition may be diagnosed based on:  Your symptoms and medical history.  A physical exam.  A pelvic exam that includes feeling for any tumors.  Imaging tests, such as ultrasound or MRI. How is this treated? Treatment for this condition may include:  Seeing your health care provider for follow-up visits to monitor your fibroids for any changes.  Taking NSAIDs such as ibuprofen, naproxen, or aspirin to reduce pain.  Hormone medicines. These may be taken as a pill, given in an injection, or delivered by a T-shaped device that is inserted into the uterus (intrauterine device, IUD).  Surgery to remove one of the following: ? The fibroids (myomectomy). Your health  care provider may recommend this if fibroids affect your fertility and you want to become pregnant. ? The uterus (hysterectomy). ? Blood supply to the fibroids (uterine artery embolization). Follow these instructions at home:  Take over-the-counter and prescription medicines only as told by your health care provider.  Ask your health care provider if you should take iron pills or eat more iron-rich foods, such as dark green, leafy vegetables. Heavy menstrual bleeding can cause low iron levels.  If directed, apply heat to your back or abdomen to reduce pain. Use the heat source that your health care provider recommends, such as a moist heat pack or a heating pad. ? Place a towel between your skin and the heat source. ? Leave the heat on for 20-30 minutes. ? Remove the heat if your skin turns bright red. This is especially important if you are unable to feel pain, heat, or cold. You may have a greater risk of getting burned.  Pay close attention to your menstrual cycle. Tell your health care provider about any changes, such as: ? Increased blood flow that requires you to use more pads or tampons than usual. ? A change in the number of days that your period lasts. ? A change in symptoms that are associated with your period, such as back pain or cramps in your abdomen.  Keep all follow-up visits as told by your health care provider. This is important, especially if your fibroids need to be monitored for any changes. Contact a health care provider if you:  Have pelvic pain, back pain, or cramps in your abdomen that   do not get better with medicine or heat.  Develop new bleeding between periods.  Have increased bleeding during or between periods.  Feel unusually tired or weak.  Feel light-headed. Get help right away if you:  Faint.  Have pelvic pain that suddenly gets worse.  Have severe vaginal bleeding that soaks a tampon or pad in 30 minutes or less. Summary  Uterine fibroids are  noncancerous (benign) tumors that can develop in the uterus.  The exact cause of this condition is not known.  Most fibroids do not require medical treatment unless they affect your ability to have children (fertility).  Contact a health care provider if you have pelvic pain, back pain, or cramps in your abdomen that do not get better with medicines.  Make sure you know what symptoms should cause you to get help right away. This information is not intended to replace advice given to you by your health care provider. Make sure you discuss any questions you have with your health care provider. Document Revised: 02/07/2017 Document Reviewed: 01/21/2017 Elsevier Patient Education  2020 [place]. Vaginal Hysterectomy  A vaginal hysterectomy is a procedure to remove all or part of the uterus through a small incision in the vagina. In this procedure, your health care provider may remove your entire uterus, including the lower end (cervix). You may need a vaginal hysterectomy to treat:  Uterine fibroids.  A condition that causes the lining of the uterus to grow in other areas (endometriosis).  Problems with pelvic support.  Cancer of the cervix, ovaries, uterus, or tissue that lines the uterus (endometrium).  Excessive (dysfunctional) uterine bleeding. When removing your uterus, your health care provider may also remove the organs that produce eggs (ovaries) and the tubes that carry eggs to your uterus (fallopian tubes). After a vaginal hysterectomy, you will no longer be able to have a baby. You will also no longer get your menstrual period. Tell a health care provider about:  Any allergies you have.  All medicines you are taking, including vitamins, herbs, eye drops, creams, and over-the-counter medicines.  Any problems you or family members have had with anesthetic medicines.  Any blood disorders you have.  Any surgeries you have had.  Any medical conditions you  have.  Whether you are pregnant or may be pregnant. What are the risks? Generally, this is a safe procedure. However, problems may occur, including:  Bleeding.  Infection.  A blood clot that forms in your leg and travels to your lungs (pulmonary embolism).  Damage to surrounding organs.  Pain during sex. What happens before the procedure?  Ask your health care provider what organs will be removed during surgery.  Ask your health care provider about: ? Changing or stopping your regular medicines. This is especially important if you are taking diabetes medicines or blood thinners. ? Taking medicines such as aspirin and ibuprofen. These medicines can thin your blood. Do not take these medicines before your procedure if your health care provider instructs you not to.  Follow instructions from your health care provider about eating or drinking restrictions.  Do not use any tobacco products, such as cigarettes, chewing tobacco, and e-cigarettes. If you need help quitting, ask your health care provider.  Plan to have someone take you home after discharge from the hospital. What happens during the procedure?  To reduce your risk of infection: ? Your health care team will wash or sanitize their hands. ? Your skin will be washed with soap.  An IV  tube will be inserted into one of your veins.  You may be given antibiotic medicine to help prevent infection.  You will be given one or more of the following: ? A medicine to help you relax (sedative). ? A medicine to numb the area (local anesthetic). ? A medicine to make you fall asleep (general anesthetic). ? A medicine that is injected into an area of your body to numb everything beyond the injection site (regional anesthetic).  Your surgeon will make an incision in your vagina.  Your surgeon will locate and remove all or part of your uterus.  Your ovaries and fallopian tubes may be removed at the same time.  The incision will be  closed with stitches (sutures) that dissolve over time. The procedure may vary among health care providers and hospitals. What happens after the procedure?  Your blood pressure, heart rate, breathing rate, and blood oxygen level will be monitored often until the medicines you were given have worn off.  You will be encouraged to get up and walk around after a few hours to help prevent complications.  You may have IV tubes in place for a few days.  You will be given pain medicine as needed.  Do not drive for 24 hours if you were given a sedative. This information is not intended to replace advice given to you by your health care provider. Make sure you discuss any questions you have with your health care provider. Document Revised: 10/19/2015 Document Reviewed: 03/12/2015 Elsevier Patient Education  [place]. Vaginal Hysterectomy, Care After Refer to this sheet in the next few weeks. These instructions provide you with information about caring for yourself after your procedure. Your health care provider may also give you more specific instructions. Your treatment has been planned according to current medical practices, but problems sometimes occur. Call your health care provider if you have any problems or questions after your procedure. What can I expect after the procedure? After the procedure, it is common to have:  Pain.  Soreness and numbness in your incision areas.  Vaginal bleeding and discharge.  Constipation.  Temporary problems emptying the bladder.  Feelings of sadness or other emotions. Follow these instructions at home: Medicines  Take over-the-counter and prescription medicines only as told by your health care provider.  If you were prescribed an antibiotic medicine, take it as told by your health care provider. Do not stop taking the antibiotic even if you start to feel better.  Do not drive or operate heavy machinery while taking prescription pain  medicine. Activity  Return to your normal activities as told by your health care provider. Ask your health care provider what activities are safe for you.  Get regular exercise as told by your health care provider. You may be told to take short walks every day and go farther each time.  Do not lift anything that is heavier than 10 lb (4.5 kg). General instructions   Do not put anything in your vagina for 6 weeks after your surgery or as told by your health care provider. This includes tampons and douches.  Do not have sex until your health care provider says you can.  Do not take baths, swim, or use a hot tub until your health care provider approves.  Drink enough fluid to keep your urine clear or pale yellow.  Do not drive for 24 hours if you were given a sedative.  Keep all follow-up visits as told by your health care provider.  This is important. Contact a health care provider if:  Your pain medicine is not helping.  You have a fever.  You have redness, swelling, or pain at your incision site.  You have blood, pus, or a bad-smelling discharge from your vagina.  You continue to have difficulty urinating. Get help right away if:  You have severe abdominal or back pain.  You have heavy bleeding from your vagina.  You have chest pain or shortness of breath. This information is not intended to replace advice given to you by your health care provider. Make sure you discuss any questions you have with your health care provider. Document Revised: 10/19/2015 Document Reviewed: 03/12/2015 Elsevier Patient Education  2020 [employer].

## 2019-09-24 NOTE — Progress Notes (Signed)
Patient states that this period start last Friday (09/17/2019) heavy and has been heavy everyday since. Patient started taking her Lysteda Friday(09/17/19)- Tuesday (09/21/19). Patient states that it has not helped her bleeding and has not seen any changes. [name] RN

## 2019-09-24 NOTE — Progress Notes (Signed)
History:  29 y.o. [id number] here today for eval of AUB. Pt reports that despite all conservative measures, she cont to have bleedings. She reports that she wants definitve management. She does not want short term relief.     The following portions of the patient's history were reviewed and updated as appropriate: allergies, current medications, past family history, past medical history, past social history, past surgical history and problem list.  Review of Systems:  Pertinent items are noted in HPI.    Objective:  Physical Exam Blood pressure 115/81, pulse 74, weight 211 lb (95.7 kg), last menstrual period 09/16/2019.  CONSTITUTIONAL: Well-developed, well-nourished female in no acute distress.  HENT:  Normocephalic, atraumatic EYES: Conjunctivae and EOM are normal. No scleral icterus.  NECK: Normal range of motion SKIN: Skin is warm and dry. No rash noted. Not diaphoretic.No pallor. [place]: Alert and oriented to person, place, and time. Normal coordination.  Abd: Soft, nontender and nondistended Pelvic: Normal appearing external genitalia; normal appearing vaginal mucosa and cervix.  Normal discharge.  Small uterus, no other palpable masses, no uterine or adnexal tenderness  Labs and Imaging 04/26/2019 CLINICAL DATA:  Pain. Pain with intrauterine device. Intrauterine device removed. LMP 04/26/2019.  EXAM: TRANSVAGINAL ULTRASOUND OF PELVIS  DOPPLER ULTRASOUND OF OVARIES  TECHNIQUE: Transvaginal ultrasound examination of the pelvis was performed including evaluation of the uterus, ovaries, adnexal regions, and pelvic cul-de-sac.  Color and duplex Doppler ultrasound was utilized to evaluate blood flow to the ovaries.  COMPARISON:  For 119 OB ultrasound  FINDINGS: Uterus  Measurements: 9.9 x 6.8 x 6.3 centimeter = volume: 224.3 mL. Solid partially calcified circumscribed mass is identified along the anterior fundal region, measuring 5.4 x 5.5 x 5.4 centimeters  and consistent with fibroid.  Endometrium  Thickness: 9.4 millimeter.  Normal in appearance.  Right ovary  Measurements: 2.8 x 1.7 x 1.7 centimeter = volume: 4.4 mL. Normal appearance/no adnexal mass.  Left ovary  Measurements: 2.9 x 1.8 x 1.7 centimeter = volume: 4.8 mL. Normal appearance/no adnexal mass.  Pulsed Doppler evaluation demonstrates normal low-resistance arterial and venous waveforms in both ovaries.  IMPRESSION: 1. 5.5 centimeter calcified fundal fibroid. 2. Endometrium normal thickness. 3. Normal appearance of both ovaries. 4. No free pelvic fluid.   Assessment & Plan:  AUB and fibroids  Patient desires surgical management of AUB and fibroids with total vaginal hysterectomy with bilateral salpingectomy.  The risks of surgery were discussed in detail with the patient including but not limited to: bleeding which may require transfusion or reoperation; infection which may require prolonged hospitalization or re-hospitalization and antibiotic therapy; injury to bowel, bladder, ureters and major vessels or other surrounding organs; need for additional procedures including laparotomy; thromboembolic phenomenon, incisional problems and other postoperative or anesthesia complications.  Patient was told that the likelihood that her condition and symptoms will be treated effectively with this surgical management was very high; the postoperative expectations were also discussed in detail. The patient also understands the alternative treatment options which were discussed in full. All questions were answered.  She was told that she will be contacted by our surgical scheduler regarding the time and date of her surgery; routine preoperative instructions of having nothing to eat or drink after midnight on the day prior to surgery and also coming to the hospital 1 1/2 hours prior to her time of surgery were also emphasized.  She was told she may be called for a preoperative  appointment about a week prior to surgery and will be given  further preoperative instructions at that visit. Printed patient education handouts about the procedure were given to the patient to review at home.  Deatra Mcmahen L. Harraway-Smith, M.D., Cherlynn June

## 2019-09-27 ENCOUNTER — Ambulatory Visit: Payer: 59 | Admitting: Obstetrics & Gynecology

## 2019-09-29 ENCOUNTER — Telehealth: Payer: Self-pay

## 2019-09-29 NOTE — Telephone Encounter (Signed)
Patient will come tomorrow and sign hysterectomy statement. [name]  RN

## 2019-09-29 NOTE — Telephone Encounter (Signed)
-----   Message from Francia Greaves sent at 09/27/2019 12:28 PM EDT ----- Regarding: [facility] HYSTERECTOMY STATEMENT - SURGERY 09/07

## 2019-09-29 NOTE — Telephone Encounter (Signed)
Called Left message for patient - needs nurse visit to come in and sign medicaid hysterectomy statement. [name] RN

## 2019-09-30 ENCOUNTER — Ambulatory Visit: Payer: 59

## 2019-10-04 ENCOUNTER — Telehealth: Payer: Self-pay

## 2019-10-04 NOTE — Telephone Encounter (Signed)
Unable to leave message.  Tried to reach patient since she didn't come to her appointment for signing her Medicaid hysterectomy statement. [name] RN

## 2019-10-11 ENCOUNTER — Ambulatory Visit: Payer: 59

## 2019-10-12 NOTE — Progress Notes (Signed)
Patient presented to sign her hysterectomy statement. Scanned into chart. [name] RN

## 2019-11-03 NOTE — Patient Instructions (Addendum)
YOU ARE SCHEDULED FOR A COVID TEST  11-12-19@ 2:55 PM.  THIS TEST MUST BE DONE BEFORE SURGERY. GO TO  [facility] [place], [place], IT IS APPROXIMATELY 2 MINUTES PAST [employer] ON THE RIGHT AND REMAIN IN YOUR CAR, THIS IS A DRIVE UP TEST. ONCE YOUR COVID TEST IS DONE PLEASE FOLLOW ALL THE QUARANTINE  INSTRUCTIONS GIVEN IN YOUR HANDOUT.      Your procedure is scheduled on 11-16-19   Report to [facility] AT  5:30 A. M.   Call this number if you have problems the morning of surgery  :[phone].    OUR ADDRESS IS [address].  WE ARE LOCATED IN [facility].  PLEASE BRING YOUR INSURANCE CARD AND PHOTO ID DAY OF SURGERY.  ONLY ONE PERSON ALLOWED IN FACILITY WAITING AREA.                                      REMEMBER:  NO SOLID FOOD AFTER MIDNIGHT.   YOU MAY HAVE A CLEAR LIQUID DIET UNTIL 4:30 AM    CLEAR LIQUID DIET   Foods Allowed                                                                     Coffee and tea, regular and decaf                              Plain Jell-O any favor except red or purple                                          Fruit ices (not with fruit pulp)                                       Iced Popsicles Carbonated beverages, regular and diet                                    Cranberry, grape and apple juices Sports drinks like Gatorade Lightly seasoned clear broth or consume(fat free) Sugar, honey syrup  _____________________________________________________________________  .    YOU MAY  BRUSH YOUR TEETH MORNING OF SURGERY AND RINSE YOUR MOUTH OUT, NO CHEWING GUM CANDY OR MINTS.   TAKE THESE MEDICATIONS MORNING OF SURGERY WITH A SIP OF WATER: None  ONE VISITOR IS ALLOWED IN WAITING ROOM ONLY DAY OF SURGERY.  NO VISITOR MAY SPEND THE NIGHT.  VISITOR ARE ALLOWED TO STAY UNTIL 800 PM.                                    DO NOT WEAR JEWERLY, MAKE UP, OR NAIL POLISH ON FINGERNAILS.  DO NOT  WEAR LOTIONS, POWDERS, PERFUMES OR DEODORANT.  DO NOT SHAVE  FOR 24 HOURS PRIOR TO DAY OF SURGERY.  CONTACTS, GLASSES, OR DENTURES MAY NOT BE WORN TO SURGERY.                                    [facility] IS NOT RESPONSIBLE  FOR ANY BELONGINGS.                                                                    Marland Kitchen                                                                                                    [facility] - Preparing for Surgery Before surgery, you can play an important role.  Because skin is not sterile, your skin needs to be as free of germs as possible.  You can reduce the number of germs on your skin by washing with CHG (chlorahexidine gluconate) soap before surgery.  CHG is an antiseptic cleaner which kills germs and bonds with the skin to continue killing germs even after washing. Please DO NOT use if you have an allergy to CHG or antibacterial soaps.  If your skin becomes reddened/irritated stop using the CHG and inform your nurse when you arrive at Short Stay. Do not shave (including legs and underarms) for at least 48 hours prior to the first CHG shower.  You may shave your face/neck. Please follow these instructions carefully:  1.  Shower with CHG Soap the night before surgery and the  morning of Surgery.  2.  If you choose to wash your hair, wash your hair first as usual with your  normal  shampoo.  3.  After you shampoo, rinse your hair and body thoroughly to remove the  shampoo.                           4.  Use CHG as you would any other liquid soap.  You can apply chg directly  to the skin and wash                       Gently with a scrungie or clean washcloth.  5.  Apply the CHG Soap to your body ONLY FROM THE NECK DOWN.   Do not use on face/ open                           Wound or open sores. Avoid contact with eyes, ears mouth and genitals (private parts).                       Wash face,  Genitals (private parts) with your normal soap.  6.  Wash  thoroughly, paying special attention to the area where your surgery  will be performed.  7.  Thoroughly rinse your body with warm water from the neck down.  8.  DO NOT shower/wash with your normal soap after using and rinsing off  the CHG Soap.                9.  Pat yourself dry with a clean towel.            10.  Wear clean pajamas.            11.  Place clean sheets on your bed the night of your first shower and do not  sleep with pets. Day of Surgery : Do not apply any lotions/deodorants the morning of surgery.  Please wear clean clothes to the hospital/surgery center.  FAILURE TO FOLLOW THESE INSTRUCTIONS MAY RESULT IN THE CANCELLATION OF YOUR SURGERY PATIENT SIGNATURE_________________________________  NURSE SIGNATURE__________________________________  ________________________________________________________________________

## 2019-11-03 NOTE — Progress Notes (Signed)
COVID Vaccine Completed: Date COVID Vaccine completed: COVID vaccine manufacturer: [place]   PCP - Trixie Dredge, PA-C Cardiologist -   Chest x-ray -  EKG -  Stress Test -  ECHO -  Cardiac Cath -   Sleep Study -  CPAP -   Fasting Blood Sugar -  Checks Blood Sugar _____ times a day  Blood Thinner Instructions: Aspirin Instructions: Last Dose:  Anesthesia review:   Patient denies shortness of breath, fever, cough and chest pain at PAT appointment   Patient verbalized understanding of instructions that were given to them at the PAT appointment. Patient was also instructed that they will need to review over the PAT instructions again at home before surgery.

## 2019-11-05 NOTE — Progress Notes (Signed)
COVID Vaccine Completed: Date COVID Vaccine completed: COVID vaccine manufacturer: [place]   PCP - Trixie Dredge, PA-C Cardiologist -   Chest x-ray -  EKG - greater than 1 year Stress Test -  ECHO -  Cardiac Cath -   Sleep Study -  CPAP -   Fasting Blood Sugar -  Checks Blood Sugar _____ times a day  Blood Thinner Instructions: Aspirin Instructions: Last Dose:  Anesthesia review:   Patient denies shortness of breath, fever, cough and chest pain at PAT appointment   Patient verbalized understanding of instructions that were given to them at the PAT appointment. Patient was also instructed that they will need to review over the PAT instructions again at home before surgery.

## 2019-11-08 ENCOUNTER — Encounter (HOSPITAL_COMMUNITY): Payer: Self-pay

## 2019-11-08 ENCOUNTER — Other Ambulatory Visit: Payer: Self-pay

## 2019-11-08 ENCOUNTER — Encounter (HOSPITAL_COMMUNITY)
Admission: RE | Admit: 2019-11-08 | Discharge: 2019-11-08 | Disposition: A | Discharge status: Home / Self Care | Payer: 59 | Source: Ambulatory Visit | Attending: Obstetrics & Gynecology | Admitting: Obstetrics & Gynecology

## 2019-11-08 DIAGNOSIS — Z01812 Encounter for preprocedural laboratory examination: Secondary | ICD-10-CM | POA: Diagnosis not present

## 2019-11-08 HISTORY — DX: Other seasonal allergic rhinitis: J30.2

## 2019-11-08 LAB — CBC
HCT: 42.6 % (ref 36.0–46.0)
Hemoglobin: 13.7 g/dL (ref 12.0–15.0)
MCH: 29.1 pg (ref 26.0–34.0)
MCHC: 32.2 g/dL (ref 30.0–36.0)
MCV: 90.4 fL (ref 80.0–100.0)
Platelets: 328 10*3/uL (ref 150–400)
RBC: 4.71 MIL/uL (ref 3.87–5.11)
RDW: 13.3 % (ref 11.5–15.5)
WBC: 8.8 10*3/uL (ref 4.0–10.5)
nRBC: 0 % (ref 0.0–0.2)

## 2019-11-08 NOTE — Progress Notes (Signed)
COVID Vaccine Completed: x2 Date COVID Vaccine completed:  Unsure of date COVID vaccine manufacturer: [employer]   PCP -Trixie Dredge, PA-C Cardiologist -   Chest x-ray -  EKG - Stress Test -  ECHO -  Cardiac Cath -   Sleep Study -  CPAP -   Fasting Blood Sugar -  Checks Blood Sugar _____ times a day  Blood Thinner Instructions: Aspirin Instructions: Last Dose:  Anesthesia review:   Patient denies shortness of breath, fever, cough and chest pain at PAT appointment   Patient verbalized understanding of instructions that were given to them at the PAT appointment. Patient was also instructed that they will need to review over the PAT instructions again at home before surgery.

## 2019-11-10 ENCOUNTER — Telehealth: Payer: Self-pay

## 2019-11-10 NOTE — Telephone Encounter (Signed)
-----   Message from Francia Greaves sent at 11/10/2019 12:14 PM EDT ----- Regarding: Change Medicaid Plan Patient needs to call TODAY and get her medicaid changed. She has [employer], we do not take that.

## 2019-11-10 NOTE — Telephone Encounter (Signed)
Patient called and made aware that [facility] doesn't accept [employer] managed Medicaid. Patient made aware that she can contact her representative and try to get her managed Medicaid switched. [name] RN

## 2019-11-12 ENCOUNTER — Other Ambulatory Visit (HOSPITAL_COMMUNITY)
Admission: RE | Admit: 2019-11-12 | Discharge: 2019-11-12 | Disposition: A | Discharge status: Home / Self Care | Payer: 59 | Source: Ambulatory Visit | Attending: Obstetrics & Gynecology | Admitting: Obstetrics & Gynecology

## 2019-11-12 DIAGNOSIS — Z20822 Contact with and (suspected) exposure to covid-19: Secondary | ICD-10-CM | POA: Insufficient documentation

## 2019-11-12 DIAGNOSIS — Z01812 Encounter for preprocedural laboratory examination: Secondary | ICD-10-CM | POA: Diagnosis present

## 2019-11-12 LAB — SARS CORONAVIRUS 2 (TAT 6-24 HRS): SARS Coronavirus 2: NEGATIVE

## 2019-11-13 HISTORY — PX: ABDOMINAL HYSTERECTOMY: SHX81

## 2019-11-16 ENCOUNTER — Ambulatory Visit (HOSPITAL_BASED_OUTPATIENT_CLINIC_OR_DEPARTMENT_OTHER)
Admission: RE | Admit: 2019-11-16 | Discharge: 2019-11-16 | Disposition: A | Discharge status: Home / Self Care | Payer: 59 | Attending: Obstetrics & Gynecology | Admitting: Obstetrics & Gynecology

## 2019-11-16 ENCOUNTER — Other Ambulatory Visit: Payer: Self-pay

## 2019-11-16 ENCOUNTER — Encounter (HOSPITAL_BASED_OUTPATIENT_CLINIC_OR_DEPARTMENT_OTHER): Payer: Self-pay | Admitting: Obstetrics & Gynecology

## 2019-11-16 ENCOUNTER — Encounter (HOSPITAL_BASED_OUTPATIENT_CLINIC_OR_DEPARTMENT_OTHER)
Admission: RE | Disposition: A | Discharge status: Home / Self Care | Payer: Self-pay | Source: Home / Self Care | Attending: Obstetrics & Gynecology

## 2019-11-16 ENCOUNTER — Observation Stay (HOSPITAL_BASED_OUTPATIENT_CLINIC_OR_DEPARTMENT_OTHER): Payer: 59 | Admitting: Anesthesiology

## 2019-11-16 DIAGNOSIS — Z6836 Body mass index (BMI) 36.0-36.9, adult: Secondary | ICD-10-CM | POA: Diagnosis not present

## 2019-11-16 DIAGNOSIS — D251 Intramural leiomyoma of uterus: Secondary | ICD-10-CM | POA: Diagnosis not present

## 2019-11-16 DIAGNOSIS — N939 Abnormal uterine and vaginal bleeding, unspecified: Secondary | ICD-10-CM

## 2019-11-16 DIAGNOSIS — Z9889 Other specified postprocedural states: Secondary | ICD-10-CM

## 2019-11-16 DIAGNOSIS — N838 Other noninflammatory disorders of ovary, fallopian tube and broad ligament: Secondary | ICD-10-CM

## 2019-11-16 DIAGNOSIS — E669 Obesity, unspecified: Secondary | ICD-10-CM | POA: Insufficient documentation

## 2019-11-16 DIAGNOSIS — K219 Gastro-esophageal reflux disease without esophagitis: Secondary | ICD-10-CM | POA: Diagnosis not present

## 2019-11-16 DIAGNOSIS — Z79899 Other long term (current) drug therapy: Secondary | ICD-10-CM | POA: Insufficient documentation

## 2019-11-16 DIAGNOSIS — D259 Leiomyoma of uterus, unspecified: Secondary | ICD-10-CM

## 2019-11-16 HISTORY — PX: VAGINAL HYSTERECTOMY: SHX2639

## 2019-11-16 LAB — TYPE AND SCREEN
ABO/RH(D): O POS
Antibody Screen: NEGATIVE

## 2019-11-16 LAB — POCT PREGNANCY, URINE: Preg Test, Ur: NEGATIVE

## 2019-11-16 SURGERY — HYSTERECTOMY, VAGINAL
Anesthesia: General | Site: Vagina | Laterality: Bilateral

## 2019-11-16 MED ORDER — BISACODYL 10 MG RE SUPP: 10.0000 mg | Freq: Every day | RECTAL | Status: DC | PRN

## 2019-11-16 MED ORDER — DEXMEDETOMIDINE (PRECEDEX) IN NS 20 MCG/5ML (4 MCG/ML) IV SYRINGE
PREFILLED_SYRINGE | INTRAVENOUS | Status: DC | PRN
  Administered 2019-11-16: 4 ug via INTRAVENOUS
  Administered 2019-11-16: 16 ug via INTRAVENOUS

## 2019-11-16 MED ORDER — OXYCODONE-ACETAMINOPHEN 5-325 MG PO TABS: 1.0000 | ORAL_TABLET | Freq: Four times a day (QID) | ORAL | 0 refills | Status: DC | PRN

## 2019-11-16 MED ORDER — PANTOPRAZOLE SODIUM 40 MG PO TBEC
DELAYED_RELEASE_TABLET | ORAL | Status: AC
  Filled 2019-11-16: qty 1

## 2019-11-16 MED ORDER — FENTANYL CITRATE (PF) 250 MCG/5ML IJ SOLN
INTRAMUSCULAR | Status: AC
  Filled 2019-11-16: qty 5

## 2019-11-16 MED ORDER — IBUPROFEN 800 MG PO TABS
800.0000 mg | ORAL_TABLET | Freq: Four times a day (QID) | ORAL | 0 refills | Status: DC
Start: 2019-11-17 — End: 2020-07-04

## 2019-11-16 MED ORDER — PANTOPRAZOLE SODIUM 40 MG PO TBEC
40.0000 mg | DELAYED_RELEASE_TABLET | Freq: Every day | ORAL | Status: DC
  Administered 2019-11-16: 40 mg via ORAL

## 2019-11-16 MED ORDER — POLYETHYLENE GLYCOL 3350 17 G PO PACK: 17.0000 g | PACK | Freq: Every day | ORAL | Status: DC | PRN

## 2019-11-16 MED ORDER — ONDANSETRON HCL 4 MG/2ML IJ SOLN
INTRAMUSCULAR | Status: AC
  Filled 2019-11-16: qty 4

## 2019-11-16 MED ORDER — MIDAZOLAM HCL 5 MG/5ML IJ SOLN
INTRAMUSCULAR | Status: DC | PRN
  Administered 2019-11-16: 2 mg via INTRAVENOUS

## 2019-11-16 MED ORDER — KCL IN DEXTROSE-NACL 20-5-0.45 MEQ/L-%-% IV SOLN
INTRAVENOUS | Status: DC
  Filled 2019-11-16: qty 1000

## 2019-11-16 MED ORDER — MENTHOL 3 MG MT LOZG: 1.0000 | LOZENGE | OROMUCOSAL | Status: DC | PRN

## 2019-11-16 MED ORDER — LIDOCAINE 2% (20 MG/ML) 5 ML SYRINGE
INTRAMUSCULAR | Status: DC | PRN
  Administered 2019-11-16: 80 mg via INTRAVENOUS

## 2019-11-16 MED ORDER — ACETAMINOPHEN 500 MG PO TABS: 1000.0000 mg | ORAL_TABLET | Freq: Once | ORAL | Status: DC

## 2019-11-16 MED ORDER — ROCURONIUM BROMIDE 10 MG/ML (PF) SYRINGE
PREFILLED_SYRINGE | INTRAVENOUS | Status: AC
  Filled 2019-11-16: qty 10

## 2019-11-16 MED ORDER — LIDOCAINE 2% (20 MG/ML) 5 ML SYRINGE
INTRAMUSCULAR | Status: AC
  Filled 2019-11-16: qty 5

## 2019-11-16 MED ORDER — ORAL CARE MOUTH RINSE: 15.0000 mL | Freq: Once | OROMUCOSAL | Status: DC

## 2019-11-16 MED ORDER — ONDANSETRON HCL 4 MG/2ML IJ SOLN
INTRAMUSCULAR | Status: DC | PRN
  Administered 2019-11-16 (×2): 4 mg via INTRAVENOUS

## 2019-11-16 MED ORDER — LACTATED RINGERS IV SOLN: INTRAVENOUS | Status: DC

## 2019-11-16 MED ORDER — POVIDONE-IODINE 10 % EX SWAB: 2.0000 "application " | Freq: Once | CUTANEOUS | Status: DC

## 2019-11-16 MED ORDER — CHLORHEXIDINE GLUCONATE 0.12 % MT SOLN: 15.0000 mL | Freq: Once | OROMUCOSAL | Status: DC

## 2019-11-16 MED ORDER — ZOLPIDEM TARTRATE 5 MG PO TABS: 5.0000 mg | ORAL_TABLET | Freq: Every evening | ORAL | Status: DC | PRN

## 2019-11-16 MED ORDER — DOCUSATE SODIUM 100 MG PO CAPS: 100.0000 mg | ORAL_CAPSULE | Freq: Two times a day (BID) | ORAL | 0 refills | Status: DC

## 2019-11-16 MED ORDER — SIMETHICONE 80 MG PO CHEW: 80.0000 mg | CHEWABLE_TABLET | Freq: Four times a day (QID) | ORAL | Status: DC | PRN

## 2019-11-16 MED ORDER — CEFAZOLIN SODIUM-DEXTROSE 2-4 GM/100ML-% IV SOLN
2.0000 g | INTRAVENOUS | Status: AC
  Administered 2019-11-16: 2 g via INTRAVENOUS

## 2019-11-16 MED ORDER — VASOPRESSIN 20 UNIT/ML IV SOLN
INTRAVENOUS | Status: DC | PRN
  Administered 2019-11-16: 30 mL via INTRAMUSCULAR

## 2019-11-16 MED ORDER — ONDANSETRON HCL 4 MG PO TABS: 4.0000 mg | ORAL_TABLET | Freq: Four times a day (QID) | ORAL | Status: DC | PRN

## 2019-11-16 MED ORDER — DEXAMETHASONE SODIUM PHOSPHATE 10 MG/ML IJ SOLN
INTRAMUSCULAR | Status: AC
  Filled 2019-11-16: qty 1

## 2019-11-16 MED ORDER — KETOROLAC TROMETHAMINE 30 MG/ML IJ SOLN
INTRAMUSCULAR | Status: DC | PRN
  Administered 2019-11-16: 30 mg via INTRAVENOUS

## 2019-11-16 MED ORDER — OXYCODONE-ACETAMINOPHEN 5-325 MG PO TABS
ORAL_TABLET | ORAL | Status: AC
Start: 2019-11-16 — End: ?
  Filled 2019-11-16: qty 1

## 2019-11-16 MED ORDER — GABAPENTIN 300 MG PO CAPS
ORAL_CAPSULE | ORAL | Status: AC
  Filled 2019-11-16: qty 1

## 2019-11-16 MED ORDER — IBUPROFEN 800 MG PO TABS: 800.0000 mg | ORAL_TABLET | Freq: Four times a day (QID) | ORAL | Status: DC

## 2019-11-16 MED ORDER — FENTANYL CITRATE (PF) 100 MCG/2ML IJ SOLN: 25.0000 ug | INTRAMUSCULAR | Status: DC | PRN

## 2019-11-16 MED ORDER — FENTANYL CITRATE (PF) 100 MCG/2ML IJ SOLN
INTRAMUSCULAR | Status: DC | PRN
Start: 2019-11-16 — End: 2019-11-16
  Administered 2019-11-16: 50 ug via INTRAVENOUS
  Administered 2019-11-16: 100 ug via INTRAVENOUS

## 2019-11-16 MED ORDER — CEFAZOLIN SODIUM-DEXTROSE 2-4 GM/100ML-% IV SOLN
INTRAVENOUS | Status: AC
  Filled 2019-11-16: qty 100

## 2019-11-16 MED ORDER — IBUPROFEN 800 MG PO TABS: 800.0000 mg | ORAL_TABLET | Freq: Three times a day (TID) | ORAL | 2 refills | Status: DC | PRN

## 2019-11-16 MED ORDER — PROPOFOL 10 MG/ML IV BOLUS
INTRAVENOUS | Status: AC
  Filled 2019-11-16: qty 40

## 2019-11-16 MED ORDER — HYDROMORPHONE HCL 1 MG/ML IJ SOLN: 0.2000 mg | INTRAMUSCULAR | Status: DC | PRN

## 2019-11-16 MED ORDER — TRAMADOL HCL 50 MG PO TABS: 50.0000 mg | ORAL_TABLET | Freq: Four times a day (QID) | ORAL | Status: DC | PRN

## 2019-11-16 MED ORDER — OXYCODONE-ACETAMINOPHEN 5-325 MG PO TABS
1.0000 | ORAL_TABLET | ORAL | Status: DC | PRN
  Administered 2019-11-16: 1 via ORAL

## 2019-11-16 MED ORDER — SOD CITRATE-CITRIC ACID 500-334 MG/5ML PO SOLN: 30.0000 mL | ORAL | Status: DC

## 2019-11-16 MED ORDER — ONDANSETRON HCL 4 MG/2ML IJ SOLN: 4.0000 mg | Freq: Four times a day (QID) | INTRAMUSCULAR | Status: DC | PRN

## 2019-11-16 MED ORDER — MIDAZOLAM HCL 2 MG/2ML IJ SOLN
INTRAMUSCULAR | Status: AC
  Filled 2019-11-16: qty 2

## 2019-11-16 MED ORDER — GABAPENTIN 300 MG PO CAPS
300.0000 mg | ORAL_CAPSULE | ORAL | Status: AC
  Administered 2019-11-16: 300 mg via ORAL

## 2019-11-16 MED ORDER — KETOROLAC TROMETHAMINE 30 MG/ML IJ SOLN
INTRAMUSCULAR | Status: AC
  Filled 2019-11-16: qty 1

## 2019-11-16 MED ORDER — ACETAMINOPHEN 500 MG PO TABS
1000.0000 mg | ORAL_TABLET | ORAL | Status: AC
  Administered 2019-11-16: 1000 mg via ORAL

## 2019-11-16 MED ORDER — OXYCODONE-ACETAMINOPHEN 5-325 MG PO TABS
1.0000 | ORAL_TABLET | Freq: Four times a day (QID) | ORAL | 0 refills | Status: DC | PRN
Start: 2019-11-16 — End: 2020-07-04

## 2019-11-16 MED ORDER — ACETAMINOPHEN 500 MG PO TABS
ORAL_TABLET | ORAL | Status: AC
  Filled 2019-11-16: qty 2

## 2019-11-16 MED ORDER — ROCURONIUM BROMIDE 100 MG/10ML IV SOLN
INTRAVENOUS | Status: DC | PRN
  Administered 2019-11-16: 80 mg via INTRAVENOUS

## 2019-11-16 MED ORDER — PHENYLEPHRINE 40 MCG/ML (10ML) SYRINGE FOR IV PUSH (FOR BLOOD PRESSURE SUPPORT)
PREFILLED_SYRINGE | INTRAVENOUS | Status: DC | PRN
  Administered 2019-11-16 (×2): 40 ug via INTRAVENOUS
  Administered 2019-11-16: 80 ug via INTRAVENOUS
  Administered 2019-11-16: 40 ug via INTRAVENOUS

## 2019-11-16 MED ORDER — KETOROLAC TROMETHAMINE 30 MG/ML IJ SOLN: 30.0000 mg | Freq: Four times a day (QID) | INTRAMUSCULAR | Status: DC

## 2019-11-16 MED ORDER — DOCUSATE SODIUM 100 MG PO CAPS
100.0000 mg | ORAL_CAPSULE | Freq: Two times a day (BID) | ORAL | Status: DC
  Administered 2019-11-16: 100 mg via ORAL

## 2019-11-16 MED ORDER — DOCUSATE SODIUM 100 MG PO CAPS
ORAL_CAPSULE | ORAL | Status: AC
  Filled 2019-11-16: qty 1

## 2019-11-16 MED ORDER — METOPROLOL TARTRATE 5 MG/5ML IV SOLN
INTRAVENOUS | Status: DC | PRN
  Administered 2019-11-16: 1 mg via INTRAVENOUS

## 2019-11-16 MED ORDER — DEXAMETHASONE SODIUM PHOSPHATE 10 MG/ML IJ SOLN
INTRAMUSCULAR | Status: DC | PRN
  Administered 2019-11-16: 10 mg via INTRAVENOUS

## 2019-11-16 MED ORDER — PROPOFOL 10 MG/ML IV BOLUS
INTRAVENOUS | Status: DC | PRN
  Administered 2019-11-16: 150 mg via INTRAVENOUS

## 2019-11-16 MED ORDER — SUGAMMADEX SODIUM 200 MG/2ML IV SOLN
INTRAVENOUS | Status: DC | PRN
  Administered 2019-11-16: 400 mg via INTRAVENOUS

## 2019-11-16 MED ORDER — PHENYLEPHRINE 40 MCG/ML (10ML) SYRINGE FOR IV PUSH (FOR BLOOD PRESSURE SUPPORT)
PREFILLED_SYRINGE | INTRAVENOUS | Status: AC
  Filled 2019-11-16: qty 10

## 2019-11-16 MED FILL — IBUPROFEN 800 MG TAB: 800 | 10 days supply | Qty: 30 | Fill #0

## 2019-11-16 MED FILL — OXYCODONE-APAP 5-325MG: 5-325 | 5 days supply | Qty: 20 | Fill #0

## 2019-11-16 SURGICAL SUPPLY — 28 items
COVER MAYO STAND STRL (DRAPES) ×2 IMPLANT
COVER WAND RF STERILE (DRAPES) ×2 IMPLANT
ELECT REM PT RETURN 9FT ADLT (ELECTROSURGICAL) ×2
ELECTRODE REM PT RTRN 9FT ADLT (ELECTROSURGICAL) ×1 IMPLANT
GAUZE PACKING 2X5 YD STRL (GAUZE/BANDAGES/DRESSINGS) ×2 IMPLANT
GLOVE BIO SURGEON STRL SZ7 (GLOVE) ×2 IMPLANT
GLOVE BIO SURGEON STRL SZ8.5 (GLOVE) ×2 IMPLANT
GLOVE BIOGEL PI IND STRL 6.5 (GLOVE) ×1 IMPLANT
GLOVE BIOGEL PI IND STRL 7.0 (GLOVE) ×4 IMPLANT
GLOVE BIOGEL PI INDICATOR 6.5 (GLOVE) ×1
GLOVE BIOGEL PI INDICATOR 7.0 (GLOVE) ×4
GOWN STRL REUS W/TWL LRG LVL3 (GOWN DISPOSABLE) ×6 IMPLANT
GOWN STRL REUS W/TWL XL LVL3 (GOWN DISPOSABLE) ×2 IMPLANT
KIT TURNOVER CYSTO (KITS) ×2 IMPLANT
MANIFOLD NEPTUNE II (INSTRUMENTS) ×2 IMPLANT
NS IRRIG 1000ML POUR BTL (IV SOLUTION) ×2 IMPLANT
PACK VAGINAL WOMENS (CUSTOM PROCEDURE TRAY) ×2 IMPLANT
PAD OB MATERNITY 4.3X12.25 (PERSONAL CARE ITEMS) ×2 IMPLANT
PAD PREP 24X48 CUFFED NSTRL (MISCELLANEOUS) ×2 IMPLANT
SPONGE LAP 4X18 RFD (DISPOSABLE) ×2 IMPLANT
SUT VIC AB 0 CT1 18XCR BRD8 (SUTURE) ×3 IMPLANT
SUT VIC AB 0 CT1 36 (SUTURE) ×4 IMPLANT
SUT VIC AB 0 CT1 8-18 (SUTURE) ×6
SUT VICRYL 0 TIES 12 18 (SUTURE) ×2 IMPLANT
SYR 30ML LL (SYRINGE) ×2 IMPLANT
SYR BULB IRRIG 60ML STRL (SYRINGE) ×2 IMPLANT
TOWEL OR 17X26 10 PK STRL BLUE (TOWEL DISPOSABLE) ×2 IMPLANT
TRAY FOLEY W/BAG SLVR 14FR LF (SET/KITS/TRAYS/PACK) ×2 IMPLANT

## 2019-11-16 NOTE — Brief Op Note (Signed)
11/16/2019  9:12 AM  PATIENT:  Penny Klein  29 y.o. female  PRE-OPERATIVE DIAGNOSIS:  AUB Uterine Fibroids  POST-OPERATIVE DIAGNOSIS:  AUB Uterine Fibroids  PROCEDURE:  Procedure(s): HYSTERECTOMY VAGINAL WITH SALPINGECTOMY (Bilateral)  SURGEON:  Surgeon(s) and Role:    * Lavonia Drafts, MD - Primary    * Jonnie Kind, MD - Assisting  ANESTHESIA:   general  EBL:  100 mL   BLOOD ADMINISTERED:none  DRAINS: none   LOCAL MEDICATIONS USED:  OTHER Dilute vasopressin solution.   SPECIMEN:  Source of Specimen:  uterus, cervix, fallopian tubes   DISPOSITION OF SPECIMEN:  PATHOLOGY  COUNTS:  YES  TOURNIQUET:  * No tourniquets in log *  DICTATION: .Note written in EPIC  PLAN OF CARE: Prolonged observation.    PATIENT DISPOSITION:  PACU - hemodynamically stable.   Delay start of Pharmacological VTE agent (>24hrs) due to surgical blood loss or risk of bleeding: yes  Complications: none immediate  Pleshette Tomasini L. Harraway-Smith, M.D., [facility]

## 2019-11-16 NOTE — Anesthesia Postprocedure Evaluation (Signed)
Anesthesia Post Note  Patient: Penny Klein  Procedure(s) Performed: HYSTERECTOMY VAGINAL WITH SALPINGECTOMY (Bilateral Vagina )     Patient location during evaluation: PACU Anesthesia Type: General Level of consciousness: awake and alert Pain management: pain level controlled Vital Signs Assessment: post-procedure vital signs reviewed and stable Respiratory status: spontaneous breathing, nonlabored ventilation, respiratory function stable and patient connected to nasal cannula oxygen Cardiovascular status: blood pressure returned to baseline and stable Postop Assessment: no apparent nausea or vomiting Anesthetic complications: no   No complications documented.  Last Vitals:  Vitals:   11/16/19 0945 11/16/19 1000  BP: 114/71 120/64  Pulse: 92 99  Resp: 16 20  Temp:    SpO2: 97% 98%    Last Pain:  Vitals:   11/16/19 1000  TempSrc:   PainSc: 0-No pain                 Creig Landin L Treylen Gibbs

## 2019-11-16 NOTE — Transfer of Care (Signed)
Immediate Anesthesia Transfer of Care Note  Patient: Penny Klein  Procedure(s) Performed: HYSTERECTOMY VAGINAL WITH SALPINGECTOMY (Bilateral Vagina )  Patient Location: PACU  Anesthesia Type:General  Level of Consciousness: drowsy  Airway & Oxygen Therapy: Patient Spontanous Breathing and Patient connected to face mask oxygen  Post-op Assessment: Report given to RN and Post -op Vital signs reviewed and stable  Post vital signs: Reviewed and stable  Last Vitals:  Vitals Value Taken Time  BP 116/95 11/16/19 0910  Temp 36.4 C 11/16/19 0910  Pulse 96 11/16/19 0914  Resp 17 11/16/19 0914  SpO2 100 % 11/16/19 0914  Vitals shown include unvalidated device data.  Last Pain:  Vitals:   11/16/19 0606  TempSrc: Oral  PainSc: 0-No pain      Patients Stated Pain Goal: 4 (80/03/49 1791)  Complications: No complications documented.

## 2019-11-16 NOTE — Anesthesia Preprocedure Evaluation (Signed)
Anesthesia Evaluation  Patient identified by MRN, date of birth, ID band Patient awake    Reviewed: Allergy & Precautions, NPO status , Patient's Chart, lab work & pertinent test results  Airway Mallampati: II  TM Distance: >3 FB Neck ROM: Full    Dental no notable dental hx.    Pulmonary neg pulmonary ROS,    Pulmonary exam normal breath sounds clear to auscultation       Cardiovascular negative cardio ROS Normal cardiovascular exam Rhythm:Regular Rate:Normal     Neuro/Psych negative neurological ROS  negative psych ROS   GI/Hepatic Neg liver ROS, GERD  Medicated,  Endo/Other  negative endocrine ROSObese BMI 36  Renal/GU negative Renal ROS  negative genitourinary   Musculoskeletal negative musculoskeletal ROS (+)   Abdominal   Peds  Hematology negative hematology ROS (+)   Anesthesia Other Findings   Reproductive/Obstetrics                             Anesthesia Physical Anesthesia Plan  ASA: II  Anesthesia Plan: General   Post-op Pain Management:    Induction: Intravenous  PONV Risk Score and Plan: 3 and Ondansetron, Dexamethasone and Midazolam  Airway Management Planned: LMA and Oral ETT  Additional Equipment:   Intra-op Plan:   Post-operative Plan: Extubation in OR  Informed Consent: I have reviewed the patients History and Physical, chart, labs and discussed the procedure including the risks, benefits and alternatives for the proposed anesthesia with the patient or authorized representative who has indicated his/her understanding and acceptance.     Dental advisory given  Plan Discussed with: CRNA  Anesthesia Plan Comments:         Anesthesia Quick Evaluation

## 2019-11-16 NOTE — H&P (Addendum)
Preoperative History and Physical  Penny Klein is a 29 y.o. [id number] s/op SVD x3 here for surgical management of AUB.   Proposed surgery: Total vaginal hysterectomy with bilateral salpingectomy  Past Medical History:  Diagnosis Date  . Abnormal Pap smear of cervix 2011  . Dysmenorrhea   . Leiomyoma of uterus   . Menometrorrhagia   . Seasonal allergies    Past Surgical History:  Procedure Laterality Date  . LEEP    . VAGINAL DELIVERY     Patient denies any cervical dysplasia or STIs. Medications Prior to Admission  Medication Sig Dispense Refill Last Dose  . omeprazole (PRILOSEC) 20 MG capsule Take 1 capsule (20 mg total) by mouth daily. (Patient taking differently: Take 20 mg by mouth daily as needed (heartburn). ) 30 capsule 0   . cyclobenzaprine (FLEXERIL) 10 MG tablet Take 1 tablet (10 mg total) by mouth 2 (two) times daily as needed for muscle spasms. (Patient not taking: Reported on 10/29/2019) 20 tablet 0 Not Taking at Unknown time  . HYDROcodone-acetaminophen (NORCO/VICODIN) 5-325 MG tablet Take 1-2 tablets by mouth every 6 (six) hours as needed for moderate pain or severe pain. (Patient not taking: Reported on 07/09/2019) 12 tablet 0   . ibuprofen (ADVIL) 600 MG tablet Take 1 tablet (600 mg total) by mouth every 6 (six) hours as needed. (Patient not taking: Reported on 07/09/2019) 30 tablet 0   . megestrol (MEGACE) 40 MG tablet Take 1 tablet (40 mg total) by mouth 2 (two) times daily. Can increase to two tablets twice a day in the event of heavy bleeding (Patient not taking: Reported on 10/29/2019) 60 tablet 5 Not Taking at Unknown time  . Norethindrone Acetate-Ethinyl Estradiol (LOESTRIN) 1.5-30 MG-MCG tablet Take 1 tablet by mouth daily. (Patient not taking: Reported on 07/09/2019) 1 Package 11   . ondansetron (ZOFRAN) 4 MG tablet Take 1 tablet (4 mg total) by mouth every 8 (eight) hours as needed for nausea or vomiting. Sublingually for 2-3 days then as needed (Patient not  taking: Reported on 10/29/2019) 20 tablet 0 Not Taking at Unknown time  . tiZANidine (ZANAFLEX) 4 MG tablet Take 1 tablet (4 mg total) by mouth every 8 (eight) hours as needed for muscle spasms. (Patient not taking: Reported on 07/09/2019) 30 tablet 0   . tranexamic acid (LYSTEDA) 650 MG TABS tablet Take 2 tablets (1,300 mg total) by mouth 3 (three) times daily. Take during menses for a maximum of five days (Patient not taking: Reported on 10/29/2019) 30 tablet 2 Not Taking at Unknown time    No Known Allergies Social History:   reports that she has never smoked. She has never used smokeless tobacco. She reports previous alcohol use. She reports that she does not use drugs. Family History  Problem Relation Age of Onset  . Lupus Paternal Uncle   . Hypertension Mother   . Hyperlipidemia Mother   . Obesity Mother   . Hypertension Father   . Anxiety disorder Father   . Hyperlipidemia Father   . Breast cancer Maternal Aunt   . Breast cancer Maternal Grandmother   . Stomach cancer Paternal Grandmother   . Breast cancer Maternal Aunt     Review of Systems: Noncontributory  PHYSICAL EXAM: Blood pressure 138/83, pulse 81, temperature 98.7 F (37.1 C), temperature source Oral, resp. rate 18, height 5\' 5"  (1.651 m), weight 96.6 kg, last menstrual period 10/16/2019, SpO2 100 %. General appearance - alert, well appearing, and in no distress Chest - clear  to auscultation, no wheezes, rales or rhonchi, symmetric air entry Heart - normal rate and regular rhythm Abdomen - soft, nontender, nondistended, no masses or organomegaly Pelvic - examination not indicated Extremities - peripheral pulses normal, no pedal edema, no clubbing or cyanosis  Labs: Results for orders placed or performed during the hospital encounter of 11/12/19 (from the past 336 hour(s))  SARS CORONAVIRUS 2 (TAT 6-24 HRS) Nasopharyngeal Nasopharyngeal Swab   Collection Time: 11/12/19  2:56 PM   Specimen: Nasopharyngeal Swab  Result  Value Ref Range   SARS Coronavirus 2 NEGATIVE NEGATIVE  Results for orders placed or performed during the hospital encounter of 11/08/19 (from the past 336 hour(s))  Type and screen [place]   Collection Time: 11/08/19 10:39 AM  Result Value Ref Range   ABO/RH(D) O POS    Antibody Screen NEG    Sample Expiration 11/22/2019,2359    Extend sample reason      NO TRANSFUSIONS OR PREGNANCY IN THE PAST 3 MONTHS Performed at [facility] [place] [address], [place], [place] [address]   CBC   Collection Time: 11/08/19 10:41 AM  Result Value Ref Range   WBC 8.8 4.0 - 10.5 K/uL   RBC 4.71 3.87 - 5.11 MIL/uL   Hemoglobin 13.7 12.0 - 15.0 g/dL   HCT 42.6 36 - 46 %   MCV 90.4 80.0 - 100.0 fL   MCH 29.1 26.0 - 34.0 pg   MCHC 32.2 30.0 - 36.0 g/dL   RDW 13.3 11.5 - 15.5 %   Platelets 328 150 - 400 K/uL   nRBC 0.0 0.0 - 0.2 %    Imaging Studies: 04/26/2019 CLINICAL DATA:  Pain. Pain with intrauterine device. Intrauterine device removed. LMP 04/26/2019.  EXAM: TRANSVAGINAL ULTRASOUND OF PELVIS  DOPPLER ULTRASOUND OF OVARIES  TECHNIQUE: Transvaginal ultrasound examination of the pelvis was performed including evaluation of the uterus, ovaries, adnexal regions, and pelvic cul-de-sac.  Color and duplex Doppler ultrasound was utilized to evaluate blood flow to the ovaries.  COMPARISON:  For 119 OB ultrasound  FINDINGS: Uterus  Measurements: 9.9 x 6.8 x 6.3 centimeter = volume: 224.3 mL. Solid partially calcified circumscribed mass is identified along the anterior fundal region, measuring 5.4 x 5.5 x 5.4 centimeters and consistent with fibroid.  Endometrium  Thickness: 9.4 millimeter.  Normal in appearance.  Right ovary  Measurements: 2.8 x 1.7 x 1.7 centimeter = volume: 4.4 mL. Normal appearance/no adnexal mass.  Left ovary  Measurements: 2.9 x 1.8 x 1.7 centimeter = volume: 4.8 mL. Normal appearance/no adnexal  mass.  Pulsed Doppler evaluation demonstrates normal low-resistance arterial and venous waveforms in both ovaries.  IMPRESSION: 1. 5.5 centimeter calcified fundal fibroid. 2. Endometrium normal thickness. 3. Normal appearance of both ovaries. 4. No free pelvic fluid.   Assessment: Patient Active Problem List   Diagnosis Date Noted  . Post-operative state 11/16/2019  . Abdominal pain, left upper quadrant 05/12/2019  . Left flank pain 05/12/2019  . De Quervain's disease (tenosynovitis) 08/11/2018  . History of cervical LEEP biopsy affecting care of mother, antepartum 10/02/2017  . Vitamin D deficiency 03/17/2017  . Abnormal weight gain 12/23/2016  . Class 1 obesity due to excess calories without serious comorbidity with body mass index (BMI) of 33.0 to 33.9 in adult 12/23/2016    Plan: Patient will undergo surgical management with Total vaginal hysterectomy with bilateral salpingectomy.  The risks of surgery were discussed in detail with the patient including but not limited to: bleeding which  may require transfusion or reoperation; infection which may require antibiotics; injury to surrounding organs which may involve bowel, bladder, ureters ; need for additional procedures including laparoscopy or laparotomy; thromboembolic phenomenon, surgical site problems and other postoperative/anesthesia complications. Likelihood of success in alleviating the patient's condition was discussed. Routine postoperative instructions will be reviewed with the patient and her family in detail after surgery.  The patient concurred with the proposed plan, giving informed written consent for the surgery.  Patient has been NPO since last night she will remain NPO for procedure.  Anesthesia and OR aware.  Preoperative prophylactic antibiotics and SCDs ordered on call to the OR.  To OR when ready.  Maycee Blasco L. Ihor Dow, M.D., [facility] 11/16/2019 7:11 AM

## 2019-11-16 NOTE — Op Note (Signed)
11/16/2019  9:12 AM  PATIENT:  Penny Klein  29 y.o. female  PRE-OPERATIVE DIAGNOSIS:  AUB Uterine Fibroids  POST-OPERATIVE DIAGNOSIS:  AUB Uterine Fibroids  PROCEDURE:  Procedure(s): HYSTERECTOMY VAGINAL WITH SALPINGECTOMY (Bilateral)  SURGEON:  Surgeon(s) and Role:    * Lavonia Drafts, MD - Primary    * Jonnie Kind, MD - Assisting  ANESTHESIA:   general  EBL:  100 mL   BLOOD ADMINISTERED:none  DRAINS: none   LOCAL MEDICATIONS USED:  OTHER Dilute vasopressin solution.   SPECIMEN:  Source of Specimen:  uterus, cervix, fallopian tubes   DISPOSITION OF SPECIMEN:  PATHOLOGY  COUNTS:  YES  TOURNIQUET:  * No tourniquets in log *  DICTATION: .Note written in EPIC  PLAN OF CARE: Prolonged observation.    PATIENT DISPOSITION:  PACU - hemodynamically stable.   Delay start of Pharmacological VTE agent (>24hrs) due to surgical blood loss or risk of bleeding: yes  Complications: none immediate  INDICATIONS: The patient is a 29 y.o. [id number] with history of symptomatic uterine fibroids/menorrhagia. The patient made a decision to undergo definite surgical treatment. On the preoperative visit, the risks, benefits, indications, and alternatives of the procedure were reviewed with the patient.  On the day of surgery, the risks of surgery were again discussed with the patient including but not limited to: bleeding which may require transfusion or reoperation; infection which may require antibiotics; injury to bowel, bladder, ureters or other surrounding organs; need for additional procedures; thromboembolic phenomenon, incisional problems and other postoperative/anesthesia complications. Written informed consent was obtained.    OPERATIVE FINDINGS: A 9 week size uterus with normal tubes and ovaries bilaterally.  DESCRIPTION OF PROCEDURE:  The patient received intravenous antibiotics and had sequential compression devices applied to her lower extremities while in the  preoperative area.  She was then taken to the operating room where general anesthesia was administered and was found to be adequate.  She was placed in the dorsal lithotomy position, and was prepped and draped in a sterile manner.  The patients bladder was drained with a red rubber catheter. After an adequate timeout was performed, attention was turned to her pelvis.  A weighted speculum was then placed in the vagina, and the anterior and posterior lips of the cervix were grasped bilaterally with tenaculums.  The cervix was then injected circumferentially with a dilute Vasopression solution.  The cervix was then circumferentially incised, and the bladder was dissected off the pubocervical fascia without complication.  Th posterior cul-de-sac was entered sharply without difficulty. A suture was placed on the posterior vagina.  A long weighted speculum was inserted into the posterior cul-de-sac.  The Heaney clamp was then used to clamp the uterosacral ligaments on either side.  They were then cut and sutured ligated with 0 Vicryl, and the ligated uterosacral ligaments were transfixed to the posterior lateral vaginal epithelium to further support the vagina and provide hemostasis. Of note, all sutures used in this case were 0 Vicryl unless otherwise noted.   The cardinal ligaments were then clamped, cut and ligated. The anterior cul-de-sac was then entered sharpely. The uterine vessels and broad ligaments were then serially clamped with the Heaney clamps, cut, and suture ligated on both sides.  Excellent hemostasis was noted at this point.  Due to the size of the uterus, it was morcellated using a coring technique.  The uterus was then delivered via the posterior cul-de-sac, and the cornua were clamped with the Heaney clamps, transected, and the uterus was delivered  and sent to pathology. These pedicles were then suture ligated to ensure hemostasis.  After completion of the hysterectomy, The fallopian tube on the left  side was grasped with a Kelly clamp, transected and suture ligated.  (The right fallopian tube was taken with the uterus).  All pedicles from the uterosacral ligament to the cornua were examined hemostasis was confirmed.  The vaginal cuff was reefed in a running locked fashion then reapproximated using figure of eight sutures care was given to incorporate the uterosacral pedicles bilaterally.  All instruments were then removed from the pelvis and a vaginal packing saturated with estrogen cream was placed.  The patient tolerated the procedure well.  All instruments, needles, and sponge counts were correct x 2. The patient was taken to the recovery room in stable condition.    Of note, patients tissue was has very poor tensile strength. I informed her of this post op and she pointed to some scar tissue post delivery.   An experienced assistant was required given the standard of surgical care given the complexity of the case.  This assistant was needed for exposure, dissection, suctioning, retraction, instrument exchange, and for overall help during the procedure.  Millenia Waldvogel L. Harraway-Smith, M.D., [facility]

## 2019-11-16 NOTE — Anesthesia Procedure Notes (Signed)
Procedure Name: Intubation Date/Time: 11/16/2019 7:40 AM Performed by: Gwyndolyn Saxon, CRNA Pre-anesthesia Checklist: Patient identified, Emergency Drugs available, Suction available and Patient being monitored Patient Re-evaluated:Patient Re-evaluated prior to induction Oxygen Delivery Method: Circle system utilized Preoxygenation: Pre-oxygenation with 100% oxygen Induction Type: IV induction Ventilation: Mask ventilation without difficulty Laryngoscope Size: Miller and 2 Grade View: Grade II Tube type: Oral Tube size: 7.0 mm Number of attempts: 1 Airway Equipment and Method: Patient positioned with wedge pillow and Stylet Placement Confirmation: ETT inserted through vocal cords under direct vision,  positive ETCO2 and breath sounds checked- equal and bilateral Secured at: 21 cm Tube secured with: Tape Dental Injury: Teeth and Oropharynx as per pre-operative assessment

## 2019-11-16 NOTE — Discharge Instructions (Signed)
Post Anesthesia Home Care Instructions  Activity: Get plenty of rest for the remainder of the day. A responsible individual must stay with you for 24 hours following the procedure.  For the next 24 hours, DO NOT: -Drive a car -Paediatric nurse -Drink alcoholic beverages -Take any medication unless instructed by your physician -Make any legal decisions or sign important papers.  Meals: Start with liquid foods such as gelatin or soup. Progress to regular foods as tolerated. Avoid greasy, spicy, heavy foods. If nausea and/or vomiting occur, drink only clear liquids until the nausea and/or vomiting subsides. Call your physician if vomiting continues.  Special Instructions/Symptoms: Your throat may feel dry or sore from the anesthesia or the breathing tube placed in your throat during surgery. If this causes discomfort, gargle with warm salt water. The discomfort should disappear within 24 hours.  If you had a scopolamine patch placed behind your ear for the management of post- operative nausea and/or vomiting:  1. The medication in the patch is effective for 72 hours, after which it should be removed.  Wrap patch in a tissue and discard in the trash. Wash hands thoroughly with soap and water. 2. You may remove the patch earlier than 72 hours if you experience unpleasant side effects which may include dry mouth, dizziness or visual disturbances. 3. Avoid touching the patch. Wash your hands with soap and water after contact with the patch.    Vaginal Hysterectomy, Care After Refer to this sheet in the next few weeks. These instructions provide you with information about caring for yourself after your procedure. Your health care provider may also give you more specific instructions. Your treatment has been planned according to current medical practices, but problems sometimes occur. Call your health care provider if you have any problems or questions after your procedure. What can I expect after  the procedure? After the procedure, it is common to have:  Pain.  Soreness and numbness in your incision areas.  Vaginal bleeding and discharge.  Constipation.  Temporary problems emptying the bladder.  Feelings of sadness or other emotions. Follow these instructions at home: Medicines  Take over-the-counter and prescription medicines only as told by your health care provider.  If you were prescribed an antibiotic medicine, take it as told by your health care provider. Do not stop taking the antibiotic even if you start to feel better.  Do not drive or operate heavy machinery while taking prescription pain medicine. Activity  Return to your normal activities as told by your health care provider. Ask your health care provider what activities are safe for you.  Get regular exercise as told by your health care provider. You may be told to take short walks every day and go farther each time.  Do not lift anything that is heavier than 10 lb (4.5 kg). General instructions   Do not put anything in your vagina for 6 weeks after your surgery or as told by your health care provider. This includes tampons and douches.  Do not have sex until your health care provider says you can.  Do not take baths, swim, or use a hot tub until your health care provider approves.  Drink enough fluid to keep your urine clear or pale yellow.  Do not drive for 24 hours if you were given a sedative.  Keep all follow-up visits as told by your health care provider. This is important. Contact a health care provider if:  Your pain medicine is not helping.  You have  a fever.  You have redness, swelling, or pain at your incision site.  You have blood, pus, or a bad-smelling discharge from your vagina.  You continue to have difficulty urinating. Get help right away if:  You have severe abdominal or back pain.  You have heavy bleeding from your vagina.  You have chest pain or shortness of  breath. This information is not intended to replace advice given to you by your health care provider. Make sure you discuss any questions you have with your health care provider. Document Revised: 10/19/2015 Document Reviewed: 03/12/2015 Elsevier Patient Education  2020 [employer].

## 2019-11-16 NOTE — Discharge Summary (Signed)
Physician Discharge Summary  Patient ID: Penny Klein MRN: [id number] DOB/AGE: 1990/11/05 29 y.o.  Admit date: 11/16/2019 Discharge date: 11/16/2019  Admission Diagnoses: AUB; uterine fibroids  Discharge Diagnoses:  Principal Problem:   Abnormal uterine bleeding (AUB) Active Problems:   Post-operative state  Discharged Condition: good  Hospital Course: Patient had an uncomplicated surgery; for further details of this surgery, please refer to the operative note. Furthermore, the patient had an uncomplicated postoperative course.  By time of discharge, her pain was well controlled on oral pain medications; she was ambulating, voiding without difficulty, tolerating regular diet and passing flatus.  She was deemed stable for discharge to home.    Significant Diagnostic Studies: labs: CBC  Treatments: surgery: total vaginal hysterectomy with bilateral salpingectomy   Discharge Exam: Blood pressure (!) 107/58, pulse 90, temperature 98.7 F (37.1 C), resp. rate 18, height 5\' 5"  (1.651 m), weight 96.6 kg, last menstrual period 10/16/2019, SpO2 100 %. Pt resting comfortably in bed. No pain reported. NAD    Disposition: Discharge disposition: 01-Home or Self Care       Discharge Instructions    Call MD for:  difficulty breathing, headache or visual disturbances   Complete by: As directed    Call MD for:  extreme fatigue   Complete by: As directed    Call MD for:  hives   Complete by: As directed    Call MD for:  persistant dizziness or light-headedness   Complete by: As directed    Call MD for:  persistant nausea and vomiting   Complete by: As directed    Call MD for:  redness, tenderness, or signs of infection (pain, swelling, redness, odor or green/yellow discharge around incision site)   Complete by: As directed    Call MD for:  severe uncontrolled pain   Complete by: As directed    Call MD for:  temperature >100.4   Complete by: As directed    Diet - low sodium heart  healthy   Complete by: As directed    Driving Restrictions   Complete by: As directed    No driving for 2 weeks   Increase activity slowly   Complete by: As directed    Lifting restrictions   Complete by: As directed    No heavy lifting for 6 weeks   Sexual Activity Restrictions   Complete by: As directed    No sexual intercourse for 6 weeks     Allergies as of 11/16/2019   No Known Allergies     Medication List    TAKE these medications   cyclobenzaprine 10 MG tablet Commonly known as: FLEXERIL Take 1 tablet (10 mg total) by mouth 2 (two) times daily as needed for muscle spasms.   docusate sodium 100 MG capsule Commonly known as: COLACE Take 1 capsule (100 mg total) by mouth 2 (two) times daily.   ibuprofen 800 MG tablet Commonly known as: ADVIL Take 1 tablet (800 mg total) by mouth every 8 (eight) hours as needed.   ibuprofen 800 MG tablet Commonly known as: ADVIL Take 1 tablet (800 mg total) by mouth every 6 (six) hours. Start taking on: November 17, 2019   omeprazole 20 MG capsule Commonly known as: PRILOSEC Take 1 capsule (20 mg total) by mouth daily. What changed:   when to take this  reasons to take this   ondansetron 4 MG tablet Commonly known as: ZOFRAN Take 1 tablet (4 mg total) by mouth every 8 (eight) hours as needed  for nausea or vomiting. Sublingually for 2-3 days then as needed   oxyCODONE-acetaminophen 5-325 MG tablet Commonly known as: PERCOCET/ROXICET Take 1 tablet by mouth every 6 (six) hours as needed.   oxyCODONE-acetaminophen 5-325 MG tablet Commonly known as: PERCOCET/ROXICET Take 1 tablet by mouth every 6 (six) hours as needed for moderate pain.   tiZANidine 4 MG tablet Commonly known as: ZANAFLEX Take 1 tablet (4 mg total) by mouth every 8 (eight) hours as needed for muscle spasms.       [place] In 2 weeks.   Specialty: Obstetrics and Gynecology Contact  information: [address] [id number] [phone]              Signed: Lavonia Drafts 11/16/2019, 5:03 PM

## 2019-11-17 ENCOUNTER — Encounter (HOSPITAL_BASED_OUTPATIENT_CLINIC_OR_DEPARTMENT_OTHER): Payer: Self-pay | Admitting: Obstetrics & Gynecology

## 2019-11-17 LAB — SURGICAL PATHOLOGY

## 2019-11-22 ENCOUNTER — Telehealth: Payer: Self-pay

## 2019-11-22 NOTE — Telephone Encounter (Signed)
Pt called stating she is constipated. Pt states she has tried suppositories, Miralax, and stool softeners. Pt states neither has provided any relief. Advised pt to take 2 stool softeners and Miralax daily, increase fiber in her diet by eating green leafy vegetables, and increase fluids. Pt advised to call the office if she doesn't have a bowel movement in 3 days. Understanding was voiced.

## 2019-11-30 ENCOUNTER — Telehealth: Payer: Self-pay

## 2019-11-30 ENCOUNTER — Other Ambulatory Visit (INDEPENDENT_AMBULATORY_CARE_PROVIDER_SITE_OTHER): Payer: Self-pay

## 2019-11-30 ENCOUNTER — Other Ambulatory Visit: Payer: Self-pay

## 2019-11-30 NOTE — Telephone Encounter (Signed)
Patient called stating that the oxycodone is making her itch. Patient states she is only taking it occasionally for cramping episodes. Patient states she is taking the 800mg  ibuprofen but wondering if there is anything else she can take. Will route to provider. She states she has follow up appointment on 12-08-2019. [name] RN

## 2019-12-08 ENCOUNTER — Encounter: Payer: Self-pay | Admitting: Obstetrics & Gynecology

## 2019-12-08 ENCOUNTER — Ambulatory Visit (INDEPENDENT_AMBULATORY_CARE_PROVIDER_SITE_OTHER): Payer: 59 | Admitting: Obstetrics & Gynecology

## 2019-12-08 ENCOUNTER — Other Ambulatory Visit: Payer: Self-pay

## 2019-12-08 VITALS — BP 108/84 | HR 83 | Temp 98.3°F | Wt 206.0 lb

## 2019-12-08 DIAGNOSIS — Z9889 Other specified postprocedural states: Secondary | ICD-10-CM

## 2019-12-08 NOTE — Progress Notes (Signed)
History:  29 y.o.LMP here today for 2 week post op check.Pt is s/p vaginal hysterectomy with bilateral salpingectomy on 11/16/2019.  Pt reports that she is doing well. She is eating and passing stols without difficulty.   Adequate pain control. Pt did develop some itching while taking the Percocet.   The following portions of the patient's history were reviewed and updated as appropriate: allergies, current medications, past family history, past medical history, past social history, past surgical history and problem list.  Review of Systems:  Pertinent items are noted in HPI.    Objective:  Physical Exam BP 108/84   Pulse 83   Temp 98.3 F (36.8 C) (Oral)   Wt 206 lb (93.4 kg)   BMI 34.28 kg/m   CONSTITUTIONAL: Well-developed, well-nourished female in no acute distress.  HENT:  Normocephalic, atraumatic EYES: Conjunctivae and EOM are normal. No scleral icterus.  NECK: Normal range of motion SKIN: Skin is warm and dry. No rash noted. Not diaphoretic.No pallor. [place]: Alert and oriented to person, place, and time. Normal coordination.  Abd: Soft, nontender and nondistended; her port sites are healing well. .  Pelvic: deferred  Labs and Imaging Surg path 11/16/2019 UTERUS, CERVIX, BILATERAL FALLOPIAN TUBES:  - Uterus:    Endometrium: Secretory endometrium. No hyperplasia or malignancy.    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    Serosa: Unremarkable. No malignancy.  - Cervix: Benign squamous and endocervical mucosa. No dysplasia or  malignancy.  - Bilateral fallopian tubes: Paratubal cyst. No malignancy.    Assessment & Plan:  2 week post op check following vaginal hysterectomy with bilateral salpingectomy.   Doing well  Reviewed her surg path.   Reviewed post op instructions and activities  Gradual increase in activities  F/u in 4 weeks or sooner prn  Reviewed no intercourse for 6 weeks post op  All questions answered.   Gift Rueckert L. Harraway-Smith, M.D., Cherlynn June

## 2019-12-08 NOTE — Progress Notes (Signed)
Patient presents for post op from vaginal hysterectomy. [name] RN

## 2019-12-08 NOTE — Patient Instructions (Signed)
Vaginal Hysterectomy, Care After Refer to this sheet in the next few weeks. These instructions provide you with information about caring for yourself after your procedure. Your health care provider may also give you more specific instructions. Your treatment has been planned according to current medical practices, but problems sometimes occur. Call your health care provider if you have any problems or questions after your procedure. What can I expect after the procedure? After the procedure, it is common to have:  Pain.  Soreness and numbness in your incision areas.  Vaginal bleeding and discharge.  Constipation.  Temporary problems emptying the bladder.  Feelings of sadness or other emotions. Follow these instructions at home: Medicines  Take over-the-counter and prescription medicines only as told by your health care provider.  If you were prescribed an antibiotic medicine, take it as told by your health care provider. Do not stop taking the antibiotic even if you start to feel better.  Do not drive or operate heavy machinery while taking prescription pain medicine. Activity  Return to your normal activities as told by your health care provider. Ask your health care provider what activities are safe for you.  Get regular exercise as told by your health care provider. You may be told to take short walks every day and go farther each time.  Do not lift anything that is heavier than 10 lb (4.5 kg). General instructions   Do not put anything in your vagina for 6 weeks after your surgery or as told by your health care provider. This includes tampons and douches.  Do not have sex until your health care provider says you can.  Do not take baths, swim, or use a hot tub until your health care provider approves.  Drink enough fluid to keep your urine clear or pale yellow.  Do not drive for 24 hours if you were given a sedative.  Keep all follow-up visits as told by your health  care provider. This is important. Contact a health care provider if:  Your pain medicine is not helping.  You have a fever.  You have redness, swelling, or pain at your incision site.  You have blood, pus, or a bad-smelling discharge from your vagina.  You continue to have difficulty urinating. Get help right away if:  You have severe abdominal or back pain.  You have heavy bleeding from your vagina.  You have chest pain or shortness of breath. This information is not intended to replace advice given to you by your health care provider. Make sure you discuss any questions you have with your health care provider. Document Revised: 10/19/2015 Document Reviewed: 03/12/2015 Elsevier Patient Education  2020 Elsevier Inc.  

## 2019-12-20 ENCOUNTER — Telehealth: Payer: Self-pay

## 2019-12-20 NOTE — Telephone Encounter (Signed)
Pt called to make sure it is ok to go back to work without restrictions on 12/28/19. Pt made aware that she can go to work without restrictions on 11/28/19. Understanding was voiced.  Penny Klein l Penny Klein, CMA

## 2019-12-24 ENCOUNTER — Telehealth: Payer: Self-pay

## 2019-12-24 NOTE — Telephone Encounter (Signed)
Pt called stating she is 5 weeks post op vaginal hysterectomy. Pt states she has been having some cramping but no bleeding. Pt made aware that cramping is normal. Understanding was voiced.  Lexani Corona l Amor Packard, CMA

## 2020-01-07 ENCOUNTER — Ambulatory Visit (INDEPENDENT_AMBULATORY_CARE_PROVIDER_SITE_OTHER): Payer: 59 | Admitting: Obstetrics & Gynecology

## 2020-01-07 ENCOUNTER — Other Ambulatory Visit: Payer: Self-pay

## 2020-01-07 ENCOUNTER — Encounter: Payer: Self-pay | Admitting: Obstetrics & Gynecology

## 2020-01-07 VITALS — BP 133/89 | HR 85

## 2020-01-07 DIAGNOSIS — Z9889 Other specified postprocedural states: Secondary | ICD-10-CM

## 2020-01-07 DIAGNOSIS — F419 Anxiety disorder, unspecified: Secondary | ICD-10-CM

## 2020-01-07 NOTE — Patient Instructions (Signed)
Managing Loss, Adult People experience loss in many different ways throughout their lives. Events such as moving, changing jobs, and losing friends can create a sense of loss. The loss may be as serious as a major health change, divorce, death of a pet, or death of a loved one. All of these types of loss are likely to create a physical and emotional reaction known as grief. Grief is the result of a major change or an absence of something or someone that you count on. Grief is a normal reaction to loss. A variety of factors can affect your grieving experience, including:  The nature of your loss.  Your relationship to what or whom you lost.  Your understanding of grief and how to manage it.  Your support system. How to manage lifestyle changes Keep to your normal routine as much as possible.  If you have trouble focusing or doing normal activities, it is acceptable to take some time away from your normal routine.  Spend time with friends and loved ones.  Eat a healthy diet, get plenty of sleep, and rest when you feel tired. How to recognize changes  The way that you deal with your grief will affect your ability to function as you normally do. When grieving, you may experience these changes:  Numbness, shock, sadness, anxiety, anger, denial, and guilt.  Thoughts about death.  Unexpected crying.  A physical sensation of emptiness in your stomach.  Problems sleeping and eating.  Tiredness (fatigue).  Loss of interest in normal activities.  Dreaming about or imagining seeing the person who died.  A need to remember what or whom you lost.  Difficulty thinking about anything other than your loss for a period of time.  Relief. If you have been expecting the loss for a while, you may feel a sense of relief when it happens. Follow these instructions at home:  Activity Express your feelings in healthy ways, such as:  Talking with others about your loss. It may be helpful to find  others who have had a similar loss, such as a support group.  Writing down your feelings in a journal.  Doing physical activities to release stress and emotional energy.  Doing creative activities like painting, sculpting, or playing or listening to music.  Practicing resilience. This is the ability to recover and adjust after facing challenges. Reading some resources that encourage resilience may help you to learn ways to practice those behaviors. General instructions  Be patient with yourself and others. Allow the grieving process to happen, and remember that grieving takes time. ? It is likely that you may never feel completely done with some grief. You may find a way to move on while still cherishing memories and feelings about your loss. ? Accepting your loss is a process. It can take months or longer to adjust.  Keep all follow-up visits as told by your health care provider. This is important. Where to find support To get support for managing loss:  Ask your health care provider for help and recommendations, such as grief counseling or therapy.  Think about joining a support group for people who are managing a loss. Where to find more information You can find more information about managing loss from:  American Society of Clinical Oncology: www.cancer.net  American Psychological Association: www.apa.org Contact a health care provider if:  Your grief is extreme and keeps getting worse.  You have ongoing grief that does not improve.  Your body shows symptoms of grief, such   as illness.  You feel depressed, anxious, or lonely. Get help right away if:  You have thoughts about hurting yourself or others. If you ever feel like you may hurt yourself or others, or have thoughts about taking your own life, get help right away. You can go to your nearest emergency department or call:  Your local emergency services (911 in the [place]  A suicide crisis helpline, such as the  National Suicide Prevention Lifeline at 1-800-273-8255. This is open 24 hours a day. Summary  Grief is the result of a major change or an absence of someone or something that you count on. Grief is a normal reaction to loss.  The depth of grief and the period of recovery depend on the type of loss and your ability to adjust to the change and process your feelings.  Processing grief requires patience and a willingness to accept your feelings and talk about your loss with people who are supportive.  It is important to find resources that work for you and to realize that people experience grief differently. There is not one grieving process that works for everyone in the same way.  Be aware that when grief becomes extreme, it can lead to more severe issues like isolation, depression, anxiety, or suicidal thoughts. Talk with your health care provider if you have any of these issues. This information is not intended to replace advice given to you by your health care provider. Make sure you discuss any questions you have with your health care provider. Document Revised: 05/01/2018 Document Reviewed: 07/11/2016 Elsevier Patient Education  2020 Elsevier Inc.  

## 2020-01-07 NOTE — Progress Notes (Signed)
History:  29 y.o.LMP here today for 2 week post op check.Pt is s/p HYSTERECTOMY VAGINAL WITH SALPINGECTOMY (Bilateral) 11/16/2019.   Pt reports that she is doing well. She is eating and passing stols without difficulty.   Pt feels occ cramping but, is off all pain meds.   Pt has a recent death of her brother and brother in law and is now feeling anxious and has a decreased appetite.    The following portions of the patient's history were reviewed and updated as appropriate: allergies, current medications, past family history, past medical history, past social history, past surgical history and problem list.  Review of Systems:  Pertinent items are noted in HPI.    Objective:  Physical Exam BP 133/89    Pulse 85   CONSTITUTIONAL: Well-developed, well-nourished female in no acute distress.  HENT:  Normocephalic, atraumatic EYES: Conjunctivae and EOM are normal. No scleral icterus.  NECK: Normal range of motion SKIN: Skin is warm and dry. No rash noted. Not diaphoretic.No pallor. [place]: Alert and oriented to person, place, and time. Normal coordination.  Abd: Soft, nontender and nondistended; her port sites are healing well. .  Pelvic: cuff well healed. No masses palpated.   Labs and Imaging Surg path 11/16/2019 UTERUS, CERVIX, BILATERAL FALLOPIAN TUBES:  - Uterus:    Endometrium: Secretory endometrium. No hyperplasia or malignancy.    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    Serosa: Unremarkable. No malignancy.  - Cervix: Benign squamous and endocervical mucosa. No dysplasia or  malignancy.  - Bilateral fallopian tubes: Paratubal cyst. No malignancy.   Assessment & Plan:  6 week post op check following HYSTERECTOMY VAGINAL WITH SALPINGECTOMY (Bilateral). Doing well  Reviewed her surg path.   Reviewed post op instructions and activities  Gradual increase in activities  F/u in 3 months  or sooner prn  May return to full activities.   Grief reaction  Referral to [facility].   All questions answered.   Cathline Dowen L. Harraway-Smith, M.D., Cherlynn June

## 2020-01-07 NOTE — Progress Notes (Signed)
Patient presents for her six week post op  Visit from hysterectomy.   Patient expressed some concerns of anxiety and unable to sleep at night. Patient shared that at the end of September she lost her brother and her brother in law (2 separate occurrences) and she is having trouble sleeping some nights.  Made her aware that she should connect with therapist and we can recommend some. Also made her aware to speak with provider about this. [name] RN

## 2020-01-12 ENCOUNTER — Encounter: Payer: Self-pay | Admitting: Obstetrics & Gynecology

## 2020-01-12 ENCOUNTER — Other Ambulatory Visit: Payer: Self-pay

## 2020-01-12 ENCOUNTER — Ambulatory Visit (INDEPENDENT_AMBULATORY_CARE_PROVIDER_SITE_OTHER): Payer: 59 | Admitting: Obstetrics & Gynecology

## 2020-01-12 VITALS — BP 118/73 | HR 76 | Ht 65.0 in | Wt 204.0 lb

## 2020-01-12 DIAGNOSIS — F32A Depression, unspecified: Secondary | ICD-10-CM

## 2020-01-12 DIAGNOSIS — Z9889 Other specified postprocedural states: Secondary | ICD-10-CM

## 2020-01-12 DIAGNOSIS — R5383 Other fatigue: Secondary | ICD-10-CM

## 2020-01-12 NOTE — Progress Notes (Signed)
Pt states she constantly feels tired. Pt states she is not sleeping well.

## 2020-01-12 NOTE — Patient Instructions (Signed)
Melatonin oral solid dosage forms What is this medicine? MELATONIN (mel uh TOH nin) is a dietary supplement. It is mostly promoted to help maintain normal sleep patterns. The FDA has not approved this supplement for any medical use. This supplement may be used for other purposes; ask your health care provider or pharmacist if you have questions. This medicine may be used for other purposes; ask your health care provider or pharmacist if you have questions. COMMON BRAND NAME(S): Melatonex What should I tell my health care provider before I take this medicine? They need to know if you have any of these conditions:  cancer  depression or mental illness  diabetes  hormone problems  if you often drink alcohol  immune system problems  liver disease  lung or breathing disease, like asthma  organ transplant  seizure disorder  an unusual or allergic reaction to melatonin, other medicines, foods, dyes, or preservatives  pregnant or trying to get pregnant  breast-feeding How should I use this medicine? Take this supplement by mouth with a glass of water. Do not take with food. This supplement is usually taken 1 or 2 hours before bedtime. After taking this supplement, limit your activities to those needed to prepare for bed. Some products may be chewed or dissolved in the mouth before swallowing. Some tablets or capsules must be swallowed whole; do not cut, crush or chew. Follow the directions on the package labeling, or take as directed by your health care professional. Do not take this supplement more often than directed. Talk to your pediatrician regarding the use of this supplement in children. Special care may be needed. This supplement is not recommended for use in children without a prescription. Overdosage: If you think you have taken too much of this medicine contact a poison control center or emergency room at once. NOTE: This medicine is only for you. Do not share this medicine  with others. What if I miss a dose? If you miss taking your dose at the usual time, skip that dose. If it is almost time for your next dose, take only that dose. Do not take double or extra doses. What may interact with this medicine? Do not take this medicine with any of the following medications:  fluvoxamine  ramelteon  tasimelteon This medicine may also interact with the following medications:  alcohol  caffeine  carbamazepine  certain antibiotics like ciprofloxacin  certain medicines for depression, anxiety, or psychotic disturbances  cimetidine  female hormones, like estrogens and birth control pills, patches, rings, or injections  methoxsalen  nifedipine  other medications for sleep  other herbal or dietary supplements  phenobarbital  rifampin  smoking tobacco  tamoxifen  warfarin This list may not describe all possible interactions. Give your health care provider a list of all the medicines, herbs, non-prescription drugs, or dietary supplements you use. Also tell them if you smoke, drink alcohol, or use illegal drugs. Some items may interact with your medicine. What should I watch for while using this medicine? See your doctor if your symptoms do not get better or if they get worse. Do not take this supplement for more than 2 weeks unless your doctor tells you to. You may get drowsy or dizzy. Do not drive, use machinery, or do anything that needs mental alertness until you know how this medicine affects you. Do not stand or sit up quickly, especially if you are an older patient. This reduces the risk of dizzy or fainting spells. Alcohol may interfere with   the effect of this medicine. Avoid alcoholic drinks. After taking this medicine, you may get up out of bed and do an activity that you do not know you are doing. The next morning, you may have no memory of this. Activities include driving a car ("sleep-driving"), making and eating food, talking on the phone,  sexual activity, and sleep-walking. Serious injuries have occurred. Call your doctor right away if you find out you have done any of these activities. Do not take this medicine if you have used alcohol that evening. Do not take it if you have taken another medicine for sleep. The risk of doing these sleep-related activities is higher. Talk to your doctor before you use this supplement if you are currently being treated for an emotional, mental, or sleep problem. This medicine may interfere with your treatment. Herbal or dietary supplements are not regulated like medicines. Rigid quality control standards are not required for dietary supplements. The purity and strength of these products can vary. The safety and effect of this dietary supplement for a certain disease or illness is not well known. This product is not intended to diagnose, treat, cure or prevent any disease. The Food and Drug Administration suggests the following to help consumers protect themselves:  Always read product labels and follow directions.  Natural does not mean a product is safe for humans to take.  Look for products that include USP after the ingredient name. This means that the manufacturer followed the standards of the [place] Pharmacopoeia.  Supplements made or sold by a nationally known food or drug company are more likely to be made under tight controls. You can write to the company for more information about how the product was made. What side effects may I notice from receiving this medicine? Side effects that you should report to your doctor or health care professional as soon as possible:  allergic reactions like skin rash, itching or hives, swelling of the face, lips, or tongue  breathing problems  confusion  depressed mood, irritable, or other changes in moods or behaviors  feeling faint or lightheaded, falls  increased blood pressure  irregular or missed menstrual periods  signs and symptoms of liver  injury like dark yellow or brown urine; general ill feeling or flu-like symptoms; light-colored stools; loss of appetite; nausea; right upper belly pain; unusually weak or tired; yellowing of the eyes or skin  trouble staying awake or alert during the day  unusual activities while you are still asleep like driving, eating, making phone calls  unusual bleeding or bruising Side effects that usually do not require medical attention (report to your doctor or health care professional if they continue or are bothersome):  dizziness  drowsiness  headache  hot flashes  nausea  tiredness  unusual dreams or nightmares  upset stomach This list may not describe all possible side effects. Call your doctor for medical advice about side effects. You may report side effects to FDA at 1-800-FDA-1088. Where should I keep my medicine? Keep out of the reach of children. Store at room temperature or as directed on the package label. Protect from moisture. Throw away any unused supplement after the expiration date. NOTE: This sheet is a summary. It may not cover all possible information. If you have questions about this medicine, talk to your doctor, pharmacist, or health care provider.  2020 Elsevier/Gold Standard (2017-08-22 12:11:58)  

## 2020-01-12 NOTE — Progress Notes (Signed)
History:  29 y.o.LMP here today for 7 week post op check.Pt is s/p Vag hyst with bilateral salpingectomy on 11/15/2019.  Pt reports that she is doing well. She is eating and passing stools without difficulty.  Pt has had multiple life stressors and she reports fatigue and insomnia. She denies bleeding or abd pain. She does report occ 'pulling' at the umbilicus. She has a h/o an umbilical hernia. She reports that she has not been sleeping well. She has not received a call from behavioral health that we referred her to. She would like to speak to someone.   Pt denies SOB or DOE.    The following portions of the patient's history were reviewed and updated as appropriate: allergies, current medications, past family history, past medical history, past social history, past surgical history and problem list.  Review of Systems:  Pertinent items are noted in HPI.    Objective:  Physical Exam Wt 204 lb (92.5 kg)   LMP 10/16/2019   BMI 33.95 kg/m    CONSTITUTIONAL: Well-developed, well-nourished female in no acute distress.  HENT:  Normocephalic, atraumatic EYES: Conjunctivae and EOM are normal. No scleral icterus.  NECK: Normal range of motion SKIN: Skin is warm and dry. No rash noted. Not diaphoretic.No pallor. Girard: Alert and oriented to person, place, and time. Normal coordination.  Abd; soft, NT, ND. Umbilicus: there is a small hernia. Unchanged from prev. No evidence of incarceration.   . Labs and Imaging Surg path  11/15/2019 UTERUS, CERVIX, BILATERAL FALLOPIAN TUBES:  - Uterus:    Endometrium: Secretory endometrium. No hyperplasia or malignancy.    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    Serosa: Unremarkable. No malignancy.  - Cervix: Benign squamous and endocervical mucosa. No dysplasia or  malignancy.  - Bilateral fallopian tubes: Paratubal cyst. No malignancy.   CBC Latest Ref Rng & Units 11/08/2019 05/13/2019 10/31/2017  WBC 4.0 - 10.5 K/uL 8.8 6.8 12.8(H)  Hemoglobin  12.0 - 15.0 g/dL 13.7 14.1 11.1(L)  Hematocrit 36 - 46 % 42.6 41.8 33.7(L)  Platelets 150 - 400 K/uL 328 301.0 215   Assessment & Plan:  7 week post op check following Vag hysterectomy with bilateral salpingectomy. Pt with some fatigue that sounds like it is mediated by depression (situational/fgrief) and insomnia.    Rec melatonin for sleep.   Discussed anti-depressants with pt. She will hold off for now.   Referral to [facility] for counseling.   CBC today  Total face-to-face time with patient was 20 min.  Greater than 50% was spent in counseling and coordination of care with the patient.     Christien Frankl L. Harraway-Smith, M.D., Cherlynn June

## 2020-01-18 NOTE — BH Specialist Note (Signed)
Pt did not arrive to video visit and did not answer the phone ; Voicemail is full and unable to leave voice message; left MyChart message for patient.

## 2020-01-24 ENCOUNTER — Ambulatory Visit: Payer: 59 | Admitting: Clinical

## 2020-01-24 DIAGNOSIS — Z5329 Procedure and treatment not carried out because of patient's decision for other reasons: Secondary | ICD-10-CM

## 2020-01-24 DIAGNOSIS — Z91199 Patient's noncompliance with other medical treatment and regimen due to unspecified reason: Secondary | ICD-10-CM

## 2020-02-10 ENCOUNTER — Encounter: Payer: Self-pay | Admitting: General Practice

## 2020-02-28 ENCOUNTER — Ambulatory Visit: Payer: 59 | Admitting: Obstetrics & Gynecology

## 2020-03-17 ENCOUNTER — Emergency Department (HOSPITAL_COMMUNITY)
Admission: EM | Admit: 2020-03-17 | Discharge: 2020-03-18 | Disposition: A | Discharge status: Home / Self Care | Payer: 59 | Attending: Emergency Medicine | Admitting: Emergency Medicine

## 2020-03-17 ENCOUNTER — Other Ambulatory Visit: Payer: Self-pay

## 2020-03-17 ENCOUNTER — Encounter (HOSPITAL_COMMUNITY): Payer: Self-pay

## 2020-03-17 DIAGNOSIS — M79602 Pain in left arm: Secondary | ICD-10-CM | POA: Diagnosis not present

## 2020-03-17 DIAGNOSIS — R079 Chest pain, unspecified: Secondary | ICD-10-CM | POA: Diagnosis not present

## 2020-03-17 DIAGNOSIS — M25512 Pain in left shoulder: Secondary | ICD-10-CM | POA: Insufficient documentation

## 2020-03-17 DIAGNOSIS — Z5321 Procedure and treatment not carried out due to patient leaving prior to being seen by health care provider: Secondary | ICD-10-CM | POA: Insufficient documentation

## 2020-03-17 LAB — CBC
HCT: 43.8 % (ref 36.0–46.0)
Hemoglobin: 14 g/dL (ref 12.0–15.0)
MCH: 28.7 pg (ref 26.0–34.0)
MCHC: 32 g/dL (ref 30.0–36.0)
MCV: 89.8 fL (ref 80.0–100.0)
Platelets: 330 10*3/uL (ref 150–400)
RBC: 4.88 MIL/uL (ref 3.87–5.11)
RDW: 13.4 % (ref 11.5–15.5)
WBC: 9.9 10*3/uL (ref 4.0–10.5)
nRBC: 0 % (ref 0.0–0.2)

## 2020-03-17 NOTE — ED Triage Notes (Signed)
Pt here for eval of CP that started yesterday, states she is unsure if it is gas or something cardiac related. Denies cardiac hx, respiratory history. Describes pain as pain that "sits, then goes away."

## 2020-03-18 ENCOUNTER — Emergency Department (HOSPITAL_BASED_OUTPATIENT_CLINIC_OR_DEPARTMENT_OTHER)
Admission: EM | Admit: 2020-03-18 | Discharge: 2020-03-18 | Disposition: A | Discharge status: Home / Self Care | Payer: 59 | Source: Home / Self Care | Attending: Emergency Medicine | Admitting: Emergency Medicine

## 2020-03-18 ENCOUNTER — Other Ambulatory Visit: Payer: Self-pay

## 2020-03-18 ENCOUNTER — Encounter (HOSPITAL_BASED_OUTPATIENT_CLINIC_OR_DEPARTMENT_OTHER): Payer: Self-pay

## 2020-03-18 DIAGNOSIS — R079 Chest pain, unspecified: Secondary | ICD-10-CM | POA: Insufficient documentation

## 2020-03-18 DIAGNOSIS — M79602 Pain in left arm: Secondary | ICD-10-CM | POA: Insufficient documentation

## 2020-03-18 DIAGNOSIS — M25512 Pain in left shoulder: Secondary | ICD-10-CM | POA: Insufficient documentation

## 2020-03-18 LAB — BASIC METABOLIC PANEL
Anion gap: 10 (ref 5–15)
BUN: 10 mg/dL (ref 6–20)
CO2: 25 mmol/L (ref 22–32)
Calcium: 9.3 mg/dL (ref 8.9–10.3)
Chloride: 102 mmol/L (ref 98–111)
Creatinine, Ser: 0.74 mg/dL (ref 0.44–1.00)
GFR, Estimated: >60 mL/min (ref >60)
Glucose, Bld: 97 mg/dL (ref 70–99)
Potassium: 3.6 mmol/L (ref 3.5–5.1)
Sodium: 137 mmol/L (ref 135–145)

## 2020-03-18 LAB — I-STAT BETA HCG BLOOD, ED (MC, WL, AP ONLY): I-stat hCG, quantitative: <5 m[IU]/mL (ref <5)

## 2020-03-18 LAB — TROPONIN I (HIGH SENSITIVITY): Troponin I (High Sensitivity): <2 ng/L (ref <18)

## 2020-03-18 NOTE — ED Provider Notes (Signed)
[facility] Emergency Department Provider Note MRN:  [id number]  Arrival date & time: 03/18/20     Chief Complaint   Chest Pain   History of Present Illness   Penny Klein is a 30 y.o. year-old female with no pertinent past medical history presenting to the ED with chief complaint of chest pain.  Location: Left arm and shoulder as well as the left lateral ribs Duration: 1 to 2 days Onset: Gradual Timing: Constant Description: Sharp Severity: Mild to moderate Exacerbating/Alleviating Factors: Worse with certain motions or positions, worse with palpation Associated Symptoms: None Pertinent Negatives: Denies dizziness or diaphoresis, no shortness of breath, no nausea vomiting, no abdominal pain   Review of Systems  A complete 10 system review of systems was obtained and all systems are negative except as noted in the HPI and PMH.   Patient's Health History    Past Medical History:  Diagnosis Date  . Abnormal Pap smear of cervix 2011  . Dysmenorrhea   . Leiomyoma of uterus   . Menometrorrhagia   . Seasonal allergies     Past Surgical History:  Procedure Laterality Date  . LEEP    . VAGINAL DELIVERY    . VAGINAL HYSTERECTOMY Bilateral 11/16/2019   Procedure: HYSTERECTOMY VAGINAL WITH SALPINGECTOMY;  Surgeon: Lavonia Drafts, MD;  Location: [facility];  Service: Gynecology;  Laterality: Bilateral;    Family History  Problem Relation Age of Onset  . Lupus Paternal Uncle   . Hypertension Mother   . Hyperlipidemia Mother   . Obesity Mother   . Hypertension Father   . Anxiety disorder Father   . Hyperlipidemia Father   . Breast cancer Maternal Aunt   . Breast cancer Maternal Grandmother   . Stomach cancer Paternal Grandmother   . Breast cancer Maternal Aunt     Social History   Socioeconomic History  . Marital status: Married    Spouse name: Not on file  . Number of children: Not on file  . Years of education:  Not on file  . Highest education level: Not on file  Occupational History  . Not on file  Tobacco Use  . Smoking status: Never Smoker  . Smokeless tobacco: Never Used  Vaping Use  . Vaping Use: Never used  Substance and Sexual Activity  . Alcohol use: Not Currently    Comment: <1 per month  . Drug use: No  . Sexual activity: Not Currently    Birth control/protection: Surgical  Other Topics Concern  . Not on file  Social History Narrative  . Not on file   Social Determinants of Health   Financial Resource Strain: Not on file  Food Insecurity: Not on file  Transportation Needs: Not on file  Physical Activity: Not on file  Stress: Not on file  Social Connections: Not on file  Intimate Partner Violence: Not on file     Physical Exam   Vitals:   03/18/20 0148  BP: (!) 134/97  Pulse: 79  Resp: 18  Temp: 98.1 F (36.7 C)  SpO2: 100%    CONSTITUTIONAL: Well-appearing, NAD NEURO:  Alert and oriented x 3, no focal deficits EYES:  eyes equal and reactive ENT/NECK:  no LAD, no JVD CARDIO: Regular rate, well-perfused, normal S1 and S2 PULM:  CTAB no wheezing or rhonchi GI/GU:  normal bowel sounds, non-distended, non-tender MSK/SPINE:  No gross deformities, no edema SKIN:  no rash, atraumatic PSYCH:  Appropriate speech and behavior  *Additional and/or pertinent  findings included in MDM below  Diagnostic and Interventional Summary    EKG Interpretation  Date/Time:  March 18, 2020 at 1:43 AM Ventricular Rate:  77 PR Interval: 144   QRS Duration: 80   QT Interval: 388   QTC Calculation: 439 R Axis:     Text Interpretation: Sinus rhythm, normal intervals, no ischemic changes Confirmed by Dr. Gerlene Fee at 2:23 AM      Labs Reviewed - No data to display  No orders to display    Medications - No data to display   Procedures  /  Critical Care Procedures  ED Course and Medical Decision Making  I have reviewed the triage vital signs, the nursing notes, and  pertinent available records from the EMR.  Listed above are laboratory and imaging tests that I personally ordered, reviewed, and interpreted and then considered in my medical decision making (see below for details).  History and exam consistent with musculoskeletal pain.  Minimal cardiovascular risk factors, EKG is normal, had labs done earlier at [facility] which are reassuring, negative troponin, appropriate for discharge with reassurance.       Barth Kirks. Sedonia Small, [place] [email or web address]  Final Clinical Impressions(s) / ED Diagnoses     ICD-10-CM   1. Chest pain, unspecified type  R07.9     ED Discharge Orders    None       Discharge Instructions Discussed with and Provided to Patient:     Discharge Instructions     You were evaluated in the Emergency Department and after careful evaluation, we did not find any emergent condition requiring admission or further testing in the hospital.  Your exam/testing today was overall reassuring.  Symptoms seem to be due to muscle strain or spasm.  We recommend Tylenol and/or Motrin at home for discomfort.  Should be feeling better within the next few days.  Please return to the Emergency Department if you experience any worsening of your condition.  Thank you for allowing [name] to be a part of your care.        Maudie Flakes, MD 03/18/20 [phone]

## 2020-03-18 NOTE — Discharge Instructions (Addendum)
You were evaluated in the Emergency Department and after careful evaluation, we did not find any emergent condition requiring admission or further testing in the hospital.  Your exam/testing today was overall reassuring.  Symptoms seem to be due to muscle strain or spasm.  We recommend Tylenol and/or Motrin at home for discomfort.  Should be feeling better within the next few days.  Please return to the Emergency Department if you experience any worsening of your condition.  Thank you for allowing [name] to be a part of your care.

## 2020-03-18 NOTE — ED Triage Notes (Signed)
Pt presents with complaints of intermittent L sided CP that radiates to L arm x 2 days. No cardiac hx.

## 2020-03-22 ENCOUNTER — Other Ambulatory Visit: Payer: Self-pay

## 2020-03-22 ENCOUNTER — Emergency Department (HOSPITAL_BASED_OUTPATIENT_CLINIC_OR_DEPARTMENT_OTHER): Admission: EM | Admit: 2020-03-22 | Discharge: 2020-03-22 | Discharge status: Absent without leave | Payer: 59

## 2020-04-05 ENCOUNTER — Ambulatory Visit: Payer: 59 | Admitting: Obstetrics & Gynecology

## 2020-05-05 ENCOUNTER — Other Ambulatory Visit (HOSPITAL_COMMUNITY): Payer: Self-pay

## 2020-05-05 MED FILL — NORTRIPTYLINE HCL 25 MG CAP: 25 | 30 days supply | Qty: 30 | Fill #0

## 2020-05-05 MED FILL — RIZATRIPTAN 5 MG TABLET: 5 | 8 days supply | Qty: 15 | Fill #0

## 2020-05-27 ENCOUNTER — Telehealth: Payer: Self-pay | Admitting: Gastroenterology

## 2020-05-27 NOTE — Telephone Encounter (Signed)
LBIG MD: Cirigliano After hours call [id number]   Developed constipation 4-5 days ago.  Called the office. Has an appointment 06/06/20. But, doesn't think she can wait that long to have a BM. With the use of Benefiber and Miralax she has only had a small bowel movement a couple of days age. Having flatus. No nausea or vomiting.  Feels full and has an ongoing sense of incomplete evacuation. Concerned about having constipation in the setting of a small umbilical hernia.   Had constipation after a hysterectomy in September 2021.  Used Miralax, drank water, and ate yogurt. Does not remember how long the symptoms lasted.   CT scan 05/17/19 showed prominent stool in the colon and a small umbilical hernia  I recommended a bowel purge with Dulcolax 20 mg x 1, followed by 6-8 doses of Miralax over 2-3 hours. Evaluation in ER if she develops nausea, vomiting, or escalating abdominal pain.   She will keep her upcoming appointment at the office.

## 2020-05-29 NOTE — Telephone Encounter (Signed)
Thanks for taking care of her over the weekend.  Olivia Mackie, can you please call to check in on her and see how she did through the remainder of the weekend. To keep her appt with Anderson Malta as scheduled. Thanks.

## 2020-05-29 NOTE — Telephone Encounter (Signed)
Spoke with the patient and she is still having gurgling in her stomach and diarrhea. The patient admits to not following the bowel purge given to her by Dr Tarri Glenn.  I told the patient I would send the purge instructions to her via my chart. The patient verbalized understanding and will call back with any additional problems or questions. She will keep her appointment this week.

## 2020-05-29 NOTE — Telephone Encounter (Signed)
Left a voicemail for patient to contact the office to discuss her how she is doing since her call on 05/27/2020 to after hours Dr, Dr Tarri Glenn.

## 2020-05-31 ENCOUNTER — Other Ambulatory Visit: Payer: Self-pay

## 2020-05-31 ENCOUNTER — Ambulatory Visit (INDEPENDENT_AMBULATORY_CARE_PROVIDER_SITE_OTHER): Payer: 59

## 2020-05-31 ENCOUNTER — Other Ambulatory Visit (HOSPITAL_COMMUNITY)
Admission: RE | Admit: 2020-05-31 | Discharge: 2020-05-31 | Disposition: A | Discharge status: Home / Self Care | Payer: 59 | Source: Ambulatory Visit | Attending: Family Medicine | Admitting: Family Medicine

## 2020-05-31 VITALS — BP 117/82 | HR 78 | Wt 190.0 lb

## 2020-05-31 DIAGNOSIS — N898 Other specified noninflammatory disorders of vagina: Secondary | ICD-10-CM

## 2020-05-31 DIAGNOSIS — R399 Unspecified symptoms and signs involving the genitourinary system: Secondary | ICD-10-CM

## 2020-05-31 LAB — POCT URINALYSIS DIPSTICK
Bilirubin, UA: NEGATIVE
Blood, UA: NEGATIVE
Glucose, UA: NEGATIVE
Ketones, UA: NEGATIVE
Nitrite, UA: NEGATIVE
Protein, UA: NEGATIVE
Spec Grav, UA: 1.015 (ref 1.010–1.025)
Urobilinogen, UA: 0.2 E.U./dL
pH, UA: 6.5 (ref 5.0–8.0)

## 2020-05-31 NOTE — Progress Notes (Signed)
Chart reviewed - agree with CMA/RN documentation.  ° °

## 2020-05-31 NOTE — Progress Notes (Signed)
SUBJECTIVE:  30 y.o. female complains of white vaginal discharge for 1 week(s). Denies abnormal vaginal bleeding or significant pelvic pain or fever. Pt also reports frequent urination x 1 week. Pt requests to be tested for STDs.   Patient's last menstrual period was 10/16/2019.  OBJECTIVE:  She appears well, afebrile. Urine dipstick: positive for WBC's.  ASSESSMENT:  Vaginal Discharge     PLAN:  GC, chlamydia, trichomonas, BVAG, CVAG probe sent to lab. Urine culture was sent to the lab. Treatment: To be determined once lab results are received ROV prn if symptoms persist or worsen.  Penny Klein l Cyndie Woodbeck, CMA

## 2020-06-01 ENCOUNTER — Other Ambulatory Visit (HOSPITAL_COMMUNITY): Payer: Self-pay | Admitting: Family Medicine

## 2020-06-01 LAB — CERVICOVAGINAL ANCILLARY ONLY
Bacterial Vaginitis (gardnerella): POSITIVE — AB
Candida Glabrata: NEGATIVE
Candida Vaginitis: POSITIVE — AB
Chlamydia: NEGATIVE
Comment: NEGATIVE
Comment: NEGATIVE
Comment: NEGATIVE
Comment: NEGATIVE
Comment: NEGATIVE
Comment: NORMAL
Neisseria Gonorrhea: NEGATIVE
Trichomonas: NEGATIVE

## 2020-06-01 MED ORDER — FLUCONAZOLE 150 MG PO TABS: 150.0000 mg | ORAL_TABLET | Freq: Once | ORAL | 3 refills | Status: AC

## 2020-06-01 MED ORDER — METRONIDAZOLE 500 MG PO TABS: 500.0000 mg | ORAL_TABLET | Freq: Two times a day (BID) | ORAL | 0 refills | Status: DC

## 2020-06-01 MED FILL — METRONIDAZOLE 500 MG TABS: 500 | 7 days supply | Qty: 14 | Fill #0

## 2020-06-01 MED FILL — FLUCONAZOLE 150 MG TABS: 150 | 1 days supply | Qty: 1 | Fill #0

## 2020-06-01 NOTE — Addendum Note (Signed)
Addended by: Truett Mainland on: 06/01/2020 01:39 PM   Modules accepted: Orders

## 2020-06-03 LAB — URINE CULTURE

## 2020-06-06 ENCOUNTER — Encounter: Payer: Self-pay | Admitting: Physician Assistant

## 2020-06-06 ENCOUNTER — Other Ambulatory Visit: Payer: Self-pay

## 2020-06-06 ENCOUNTER — Ambulatory Visit (INDEPENDENT_AMBULATORY_CARE_PROVIDER_SITE_OTHER): Payer: 59 | Admitting: Physician Assistant

## 2020-06-06 VITALS — BP 118/78 | HR 83 | Ht 65.0 in | Wt 190.8 lb

## 2020-06-06 DIAGNOSIS — K5900 Constipation, unspecified: Secondary | ICD-10-CM | POA: Diagnosis not present

## 2020-06-06 DIAGNOSIS — K429 Umbilical hernia without obstruction or gangrene: Secondary | ICD-10-CM

## 2020-06-06 NOTE — Progress Notes (Signed)
Chief Complaint: Constipation  HPI:    Penny Klein is a 30 year old African-American female with a past medical history as listed below, assigned to Dr. Bryan Lemma, who presents clinic today for constipation.    05/11/2019 patient seen in clinic by Carl Best, NP for left upper quadrant pain.  At that time a CBC, CMP and CRP were ordered.  As well as CTAP with contrast.  Recommended EGD as CT negative.  Start omeprazole 20 mg once daily and Zofran.    05/17/2019 CT abdomen pelvis with contrast showed prominent stool throughout the colon favoring constipation, rim calcified fundal fibroid and hypodense lesion in the left ovary is likely a cyst as well as small umbilical hernia which contains adipose tissue.    03/17/2020 CBC and BMP normal.    05/27/2020 patient called our on-call physician after hours and describe developing constipation 4 to 5 days ago.  At that time described having constipation after hysterectomy in September 2021 and use MiraLAX and drink water and ate yogurt.  Is recommended she do a bowel purge with Dulcolax 20 mg x 1 followed by 6-8 doses of MiraLAX over 2 to 3 hours.    05/29/2020 our clinic followed up with the patient she describes still having gurgling in her stomach and diarrhea.  She did not follow the bowel purge given to her by Dr. Tarri Glenn.  She was told to do this.    Today, the patient tells me that since doing the bowel purge over the past week she has felt completely normal having a solid full stool 1-2 times per day.  She has increased her water intake and is trying to drink at least four 16 ounce bottles of water as well as increased fiber in her regular diet.    Also complains of occasional periumbilical pain over the area of "my hernia".  Tells me that sometimes this is there just when she wakes from sleeping and "I do not lay on my stomach or anything", it will typically just be something that she "feels", not necessarily a pain throughout the day, but  patient is worried that this may become aggravated when she starts doing more exercise like sit ups etc.    Denies fever, chills, weight loss or blood in her stool  Past Medical History:  Diagnosis Date  . Abnormal Pap smear of cervix 2011  . Dysmenorrhea   . Leiomyoma of uterus   . Menometrorrhagia   . Seasonal allergies     Past Surgical History:  Procedure Laterality Date  . LEEP    . VAGINAL DELIVERY    . VAGINAL HYSTERECTOMY Bilateral 11/16/2019   Procedure: HYSTERECTOMY VAGINAL WITH SALPINGECTOMY;  Surgeon: Lavonia Drafts, MD;  Location: [facility];  Service: Gynecology;  Laterality: Bilateral;    Current Outpatient Medications  Medication Sig Dispense Refill  . cyclobenzaprine (FLEXERIL) 10 MG tablet Take 1 tablet (10 mg total) by mouth 2 (two) times daily as needed for muscle spasms. (Patient not taking: No sig reported) 20 tablet 0  . docusate sodium (COLACE) 100 MG capsule Take 1 capsule (100 mg total) by mouth 2 (two) times daily. (Patient not taking: No sig reported) 10 capsule 0  . ibuprofen (ADVIL) 800 MG tablet Take 1 tablet (800 mg total) by mouth every 8 (eight) hours as needed. (Patient not taking: No sig reported) 30 tablet 2  . ibuprofen (ADVIL) 800 MG tablet Take 1 tablet (800 mg total) by mouth every 6 (six) hours. (Patient  not taking: No sig reported) 30 tablet 0  . metroNIDAZOLE (FLAGYL) 500 MG tablet Take 1 tablet (500 mg total) by mouth 2 (two) times daily. 14 tablet 0  . omeprazole (PRILOSEC) 20 MG capsule Take 1 capsule (20 mg total) by mouth daily. (Patient not taking: No sig reported) 30 capsule 0  . ondansetron (ZOFRAN) 4 MG tablet Take 1 tablet (4 mg total) by mouth every 8 (eight) hours as needed for nausea or vomiting. Sublingually for 2-3 days then as needed (Patient not taking: No sig reported) 20 tablet 0  . oxyCODONE-acetaminophen (PERCOCET/ROXICET) 5-325 MG tablet Take 1 tablet by mouth every 6 (six) hours as needed. (Patient  not taking: No sig reported) 20 tablet 0  . oxyCODONE-acetaminophen (PERCOCET/ROXICET) 5-325 MG tablet Take 1 tablet by mouth every 6 (six) hours as needed for moderate pain. (Patient not taking: No sig reported) 20 tablet 0  . tiZANidine (ZANAFLEX) 4 MG tablet Take 1 tablet (4 mg total) by mouth every 8 (eight) hours as needed for muscle spasms. (Patient not taking: No sig reported) 30 tablet 0   No current facility-administered medications for this visit.    Allergies as of 06/06/2020  . (No Known Allergies)    Family History  Problem Relation Age of Onset  . Lupus Paternal Uncle   . Hypertension Mother   . Hyperlipidemia Mother   . Obesity Mother   . Hypertension Father   . Anxiety disorder Father   . Hyperlipidemia Father   . Breast cancer Maternal Aunt   . Breast cancer Maternal Grandmother   . Stomach cancer Paternal Grandmother   . Breast cancer Maternal Aunt     Social History   Socioeconomic History  . Marital status: Married    Spouse name: Not on file  . Number of children: Not on file  . Years of education: Not on file  . Highest education level: Not on file  Occupational History  . Not on file  Tobacco Use  . Smoking status: Never Smoker  . Smokeless tobacco: Never Used  Vaping Use  . Vaping Use: Never used  Substance and Sexual Activity  . Alcohol use: Not Currently    Comment: <1 per month  . Drug use: No  . Sexual activity: Not Currently    Birth control/protection: Surgical  Other Topics Concern  . Not on file  Social History Narrative  . Not on file   Social Determinants of Health   Financial Resource Strain: Not on file  Food Insecurity: Not on file  Transportation Needs: Not on file  Physical Activity: Not on file  Stress: Not on file  Social Connections: Not on file  Intimate Partner Violence: Not on file    Review of Systems:    Constitutional: No weight loss, fever or chills Cardiovascular: No chest pain  Respiratory: No SOB   Gastrointestinal: See HPI and otherwise negative   Physical Exam:  Vital signs: BP 118/78   Pulse 83   Ht 5\' 5"  (1.651 m)   Wt 190 lb 12.8 oz (86.5 kg)   LMP 10/16/2019   SpO2 99%   BMI 31.75 kg/m   Constitutional:   Pleasant overweight AA female appears to be in NAD, Well developed, Well nourished, alert and cooperative Respiratory: Respirations even and unlabored. Lungs clear to auscultation bilaterally.   No wheezes, crackles, or rhonchi.  Cardiovascular: Normal S1, S2. No MRG. Regular rate and rhythm. No peripheral edema, cyanosis or pallor.  Gastrointestinal:  Soft, nondistended, nontender.  No rebound or guarding. Normal bowel sounds. No appreciable masses or hepatomegaly.+periumbilical hernia, no tttp Rectal:  Not performed.  Psychiatric: Demonstrates good judgement and reason without abnormal affect or behaviors.  No recent labs or imaging.  Assessment: 1.  Constipation: Better after MiraLAX purge and increasing fiber/water in her diet 2.  Periumbilical hernia: Patient can feel this is there throughout the day, does bother her some  Plan: 1.  Discussed constipation, the mainstays are increased fiber, water and exercise.  Encouraged the patient to drink half of her body weight in ounces of water a day and continue her high-fiber diet, 25-35 g/day.  Discussed adding in a fiber supplement such as Metamucil, Citrucel or Benefiber on a daily basis. 2.  Referred the patient to [facility] for further discussion about her periumbilical hernia.  Explained that this is very small and I do not believe it will cause her any sort of difficulty in the near future, even with an increase in exercise, but she can at least speak with them about it and decide what she wants to do. 3.  Patient to follow in clinic as needed with [name] in the future.  Ellouise Newer, PA-C [facility] 06/06/2020, 9:09 AM

## 2020-06-06 NOTE — Patient Instructions (Signed)
If you are age 31 or older, your body mass index should be between 23-30. Your Body mass index is 31.75 kg/m. If this is out of the aforementioned range listed, please consider follow up with your Primary Care Provider.  If you are age 40 or younger, your body mass index should be between 19-25. Your Body mass index is 31.75 kg/m. If this is out of the aformentioned range listed, please consider follow up with your Primary Care Provider.    We have sent a referral to [facility] and they will call you with an appointment .  Continue to drink water , exercise ,and take fiber.  Follow up as needed.  Thank you for choosing me and [facility].  Ellouise Newer , PA-C

## 2020-06-08 ENCOUNTER — Other Ambulatory Visit (HOSPITAL_COMMUNITY): Payer: Self-pay | Admitting: Family Medicine

## 2020-06-08 MED ORDER — AMOXICILLIN 875 MG PO TABS: 875.0000 mg | ORAL_TABLET | Freq: Two times a day (BID) | ORAL | 0 refills | Status: AC

## 2020-06-08 MED FILL — AMOXICILLIN 875 MG TABS: 875 | 5 days supply | Qty: 10 | Fill #0

## 2020-06-08 NOTE — Progress Notes (Signed)
Agree with the assessment and plan as outlined by Jennifer Lemmon, PA-C. ? ?Tine Mabee, DO, FACG ? ?

## 2020-06-08 NOTE — Addendum Note (Signed)
Addended by: Truett Mainland on: 06/08/2020 08:25 AM   Modules accepted: Orders

## 2020-06-09 ENCOUNTER — Emergency Department (HOSPITAL_BASED_OUTPATIENT_CLINIC_OR_DEPARTMENT_OTHER)
Admission: EM | Admit: 2020-06-09 | Discharge: 2020-06-09 | Disposition: A | Discharge status: Home / Self Care | Payer: 59 | Attending: Emergency Medicine | Admitting: Emergency Medicine

## 2020-06-09 ENCOUNTER — Encounter (HOSPITAL_BASED_OUTPATIENT_CLINIC_OR_DEPARTMENT_OTHER): Payer: Self-pay | Admitting: *Deleted

## 2020-06-09 ENCOUNTER — Other Ambulatory Visit: Payer: Self-pay

## 2020-06-09 DIAGNOSIS — R5381 Other malaise: Secondary | ICD-10-CM | POA: Diagnosis not present

## 2020-06-09 DIAGNOSIS — M545 Low back pain, unspecified: Secondary | ICD-10-CM | POA: Diagnosis present

## 2020-06-09 DIAGNOSIS — N390 Urinary tract infection, site not specified: Secondary | ICD-10-CM | POA: Diagnosis not present

## 2020-06-09 LAB — URINALYSIS, ROUTINE W REFLEX MICROSCOPIC
Bilirubin Urine: NEGATIVE
Glucose, UA: NEGATIVE mg/dL
Ketones, ur: 15 mg/dL — AB
Leukocytes,Ua: NEGATIVE
Nitrite: NEGATIVE
Protein, ur: NEGATIVE mg/dL
Specific Gravity, Urine: 1.025 (ref 1.005–1.030)
pH: 6 (ref 5.0–8.0)

## 2020-06-09 LAB — URINALYSIS, MICROSCOPIC (REFLEX)

## 2020-06-09 LAB — PREGNANCY, URINE: Preg Test, Ur: NEGATIVE

## 2020-06-09 MED ORDER — CEFTRIAXONE SODIUM 1 G IJ SOLR
1.0000 g | Freq: Once | INTRAMUSCULAR | Status: AC
  Administered 2020-06-09: 1 g via INTRAMUSCULAR
  Filled 2020-06-09: qty 10

## 2020-06-09 NOTE — Discharge Instructions (Addendum)
Call your primary care doctor or specialist as discussed in the next 2-3 days.  Continue with antibiotics that your doctor wrote for you.  Return immediately back to the ER if:  Your symptoms worsen within the next 12-24 hours. You develop new symptoms such as new fevers, persistent vomiting, new pain, shortness of breath, or new weakness or numbness, or if you have any other concerns.

## 2020-06-09 NOTE — ED Provider Notes (Signed)
[facility] EMERGENCY DEPARTMENT Provider Note   CSN: [id number] Arrival date & time: 06/09/20  1243     History Chief Complaint  Patient presents with  . Back Pain    Penny Klein is a 30 y.o. female.  Patient presents with complaints of lower back pain, as well as generalized malaise ongoing for 10 days.  Denies fevers or vomiting.  No nausea no cough no diarrhea.  She said she was diagnosed with bacterial vaginosis, yeast infection, and urinary tract infection with her primary care doctor about a week ago.  She has been on Flagyl fluconazole and recently started amoxicillin.  She had persistent symptoms of generalized malaise and lower back pain so presents to the ER.        Past Medical History:  Diagnosis Date  . Abnormal Pap smear of cervix 2011  . Dysmenorrhea   . Leiomyoma of uterus   . Menometrorrhagia   . Seasonal allergies     Patient Active Problem List   Diagnosis Date Noted  . Post-operative state 11/16/2019  . Abnormal uterine bleeding (AUB) 11/16/2019  . Abdominal pain, left upper quadrant 05/12/2019  . Left flank pain 05/12/2019  . De Quervain's disease (tenosynovitis) 08/11/2018  . History of cervical LEEP biopsy affecting care of mother, antepartum 10/02/2017  . Vitamin D deficiency 03/17/2017  . Abnormal weight gain 12/23/2016  . Class 1 obesity due to excess calories without serious comorbidity with body mass index (BMI) of 33.0 to 33.9 in adult 12/23/2016    Past Surgical History:  Procedure Laterality Date  . LEEP    . VAGINAL DELIVERY    . VAGINAL HYSTERECTOMY Bilateral 11/16/2019   Procedure: HYSTERECTOMY VAGINAL WITH SALPINGECTOMY;  Surgeon: Lavonia Drafts, MD;  Location: [facility];  Service: Gynecology;  Laterality: Bilateral;     OB History    Gravida  3   Para  2   Term  2   Preterm      AB      Living  2     SAB      IAB      Ectopic      Multiple      Live Births  2            Family History  Problem Relation Age of Onset  . Lupus Paternal Uncle   . Hypertension Mother   . Hyperlipidemia Mother   . Obesity Mother   . Hypertension Father   . Anxiety disorder Father   . Hyperlipidemia Father   . Breast cancer Maternal Aunt   . Breast cancer Maternal Grandmother   . Stomach cancer Paternal Grandmother   . Breast cancer Maternal Aunt     Social History   Tobacco Use  . Smoking status: Never Smoker  . Smokeless tobacco: Never Used  Vaping Use  . Vaping Use: Never used  Substance Use Topics  . Alcohol use: Not Currently    Comment: <1 per month  . Drug use: No    Home Medications Prior to Admission medications   Medication Sig Start Date End Date Taking? Authorizing Provider  amoxicillin (AMOXIL) 875 MG tablet Take 1 tablet (875 mg total) by mouth 2 (two) times daily for 5 days. 06/08/20 06/13/20 Yes Truett Mainland, DO  cyclobenzaprine (FLEXERIL) 10 MG tablet Take 1 tablet (10 mg total) by mouth 2 (two) times daily as needed for muscle spasms. Patient not taking: No sig reported 05/07/19   Orvan July,  NP  docusate sodium (COLACE) 100 MG capsule Take 1 capsule (100 mg total) by mouth 2 (two) times daily. Patient not taking: No sig reported 11/16/19   Lavonia Drafts, MD  ibuprofen (ADVIL) 800 MG tablet Take 1 tablet (800 mg total) by mouth every 8 (eight) hours as needed. Patient not taking: No sig reported 11/16/19   Lavonia Drafts, MD  ibuprofen (ADVIL) 800 MG tablet Take 1 tablet (800 mg total) by mouth every 6 (six) hours. Patient not taking: No sig reported 11/17/19   Lavonia Drafts, MD  metroNIDAZOLE (FLAGYL) 500 MG tablet Take 1 tablet (500 mg total) by mouth 2 (two) times daily. Patient not taking: No sig reported 06/01/20   Truett Mainland, DO  omeprazole (PRILOSEC) 20 MG capsule Take 1 capsule (20 mg total) by mouth daily. Patient not taking: No sig reported 05/11/19   Noralyn Pick, NP  ondansetron  (ZOFRAN) 4 MG tablet Take 1 tablet (4 mg total) by mouth every 8 (eight) hours as needed for nausea or vomiting. Sublingually for 2-3 days then as needed Patient not taking: No sig reported 05/11/19   Noralyn Pick, NP  oxyCODONE-acetaminophen (PERCOCET/ROXICET) 5-325 MG tablet Take 1 tablet by mouth every 6 (six) hours as needed. Patient not taking: No sig reported 11/16/19   Lavonia Drafts, MD  oxyCODONE-acetaminophen (PERCOCET/ROXICET) 5-325 MG tablet Take 1 tablet by mouth every 6 (six) hours as needed for moderate pain. Patient not taking: No sig reported 11/16/19   Lavonia Drafts, MD  tiZANidine (ZANAFLEX) 4 MG tablet Take 1 tablet (4 mg total) by mouth every 8 (eight) hours as needed for muscle spasms. Patient not taking: No sig reported 05/05/19   Melynda Ripple, MD    Allergies    Patient has no known allergies.  Review of Systems   Review of Systems  Constitutional: Negative for fever.  HENT: Negative for ear pain.   Eyes: Negative for pain.  Respiratory: Negative for cough.   Cardiovascular: Negative for chest pain.  Gastrointestinal: Negative for abdominal pain.  Genitourinary: Negative for flank pain.  Musculoskeletal: Positive for back pain.  Skin: Negative for rash.  Neurological: Negative for headaches.    Physical Exam Updated Vital Signs BP 118/82 (BP Location: Right Arm)   Pulse 88   Temp 98.2 F (36.8 C) (Oral)   Resp 16   Ht 5\' 5"  (1.651 m)   Wt 86 kg   LMP 10/16/2019   SpO2 100%   BMI 31.55 kg/m   Physical Exam Constitutional:      General: She is not in acute distress.    Appearance: Normal appearance.  HENT:     Head: Normocephalic.     Nose: Nose normal.  Eyes:     Extraocular Movements: Extraocular movements intact.  Cardiovascular:     Rate and Rhythm: Normal rate.  Pulmonary:     Effort: Pulmonary effort is normal.  Abdominal:     Tenderness: There is no right CVA tenderness or left CVA tenderness.   Musculoskeletal:        General: Normal range of motion.     Cervical back: Normal range of motion.     Comments: Left armpit, patient has a small abscess approximately 0.5 cm.  Minimally tender to palpation no cellulitis noted.  Neurological:     General: No focal deficit present.     Mental Status: She is alert. Mental status is at baseline.     ED Results / Procedures / Treatments  Labs (all labs ordered are listed, but only abnormal results are displayed) Labs Reviewed  URINALYSIS, ROUTINE W REFLEX MICROSCOPIC - Abnormal; Notable for the following components:      Result Value   Hgb urine dipstick TRACE (*)    Ketones, ur 15 (*)    All other components within normal limits  URINALYSIS, MICROSCOPIC (REFLEX) - Abnormal; Notable for the following components:   Bacteria, UA FEW (*)    All other components within normal limits  URINE CULTURE  PREGNANCY, URINE    EKG None  Radiology No results found.  Procedures Procedures   Medications Ordered in ED Medications  cefTRIAXone (ROCEPHIN) injection 1 g (has no administration in time range)    ED Course  I have reviewed the triage vital signs and the nursing notes.  Pertinent labs & imaging results that were available during my care of the patient were reviewed by me and considered in my medical decision making (see chart for details).    MDM Rules/Calculators/A&P                          Urinalysis shows small amount of bacteria.  Vital signs otherwise within normal limits for the patient.  Urinalysis has small amount of bacteria but negative nitrates negative leukocytes.  Patient given Rocephin here given she has persistent symptoms.  However she denies any fevers at home.  Urine culture sent and pending result.  Recommending follow-up with her doctor again within 3 to 4 days.  Advising immediate return for fevers worsening symptoms or pain.  Risks and benefits of incision and drainage of very small left axillary  abscess discussed.  Patient to pursue warm compress for the next 3 to 4 days and to return if the axillary lesion appears larger or more painful.  Final Clinical Impression(s) / ED Diagnoses Final diagnoses:  Lower urinary tract infectious disease    Rx / DC Orders ED Discharge Orders    None       Luna Fuse, MD 06/09/20 1422

## 2020-06-09 NOTE — ED Notes (Signed)
ED Provider at bedside. 

## 2020-06-09 NOTE — ED Triage Notes (Signed)
Back pain. Yesterday her GYN gave her  Amoxicillin to treat a UTI. She has had one dose.

## 2020-06-10 LAB — URINE CULTURE: Culture: NO GROWTH

## 2020-06-13 ENCOUNTER — Telehealth: Payer: Self-pay | Admitting: Physician Assistant

## 2020-06-13 NOTE — Telephone Encounter (Signed)
Lm on vm for patient to return call 

## 2020-06-13 NOTE — Telephone Encounter (Signed)
Patient called states she had a UTI and after taking antibiotics she has been having stomach issues\pain please advise.

## 2020-06-14 ENCOUNTER — Other Ambulatory Visit (HOSPITAL_BASED_OUTPATIENT_CLINIC_OR_DEPARTMENT_OTHER): Payer: Self-pay

## 2020-06-14 MED ORDER — OMEPRAZOLE 20 MG PO CPDR
20.0000 mg | DELAYED_RELEASE_CAPSULE | Freq: Every day | ORAL | 0 refills | Status: DC
  Filled 2020-06-14: qty 30, 30d supply, fill #0

## 2020-06-14 NOTE — Telephone Encounter (Signed)
Spoke with patient, she states that she woke up yesterday feeling nauseated and felt an irritation at the top of her stomach. She states that she felt like if she vomited she would feel better. Patient did not vomit but states that she has been having watery diarrhea since yesterday. Denies a fever but does report some chills yesterday. Pt states that she recently had a UTI, yeast infection and bacterial vaginosis all at once and was treated with Amoxicillin and Rocephin injection. Patient also reports that her husband has been having similar GI symptoms so she was not sure if it was something viral. Patient states that she has not been taking Omeprazole 20 mg because it was not refilled. Advised her to take the Zofran for the nausea. Please advise on further recommendations, thanks.

## 2020-06-14 NOTE — Telephone Encounter (Signed)
Spoke with patient in regards to recommendations. Patient will restart Omeprazole 20 mg daily - requested that this be sent to [facility]. Advised patient to being Align probiotic daily. Patient will call us within the next week with an update on symptoms or sooner if symptoms worsen. Patient is aware that it may take a few days for the Omeprazole to get back in her system but will hopefully help. She has been advised to try to stay on a bland diet if possible until she can begin the medication. Pt verbalized understanding and had no concerns at the end of the call.

## 2020-06-14 NOTE — Telephone Encounter (Signed)
Tell her to go ahead and restart the Omeprazole 20 mg daily, she can just take this for a couple of weeks to see if it helps with the irritation.  She may have some viral GI issues.  Would recommend that she go ahead and start a probiotic such as Align once daily over-the-counter in case this is related to recent antibiotics she was on.  Have her call and update [name] in the next week or so or especially if symptoms get worse.  Thanks, J LL

## 2020-06-14 NOTE — Addendum Note (Signed)
Addended by: Yevette Edwards on: 06/14/2020 02:22 PM   Modules accepted: Orders

## 2020-06-16 ENCOUNTER — Telehealth: Payer: Self-pay | Admitting: Physician Assistant

## 2020-06-16 NOTE — Telephone Encounter (Signed)
Pt I still having same sxs, pt reported that she has been having a lot of gas and would like some advise. Pls call her.

## 2020-06-16 NOTE — Telephone Encounter (Signed)
Spoke with patient, she states that after the "stomach bug" she has been having issues emptying completely. Pt states that her last "regular" bowel movement was yesterday but she did not feel like she emptied completely and feels like she has trapped gas that radiates to her lower back and side and left arm. Patient denied any numbness and tingling in her left arm, she does report some lifting today that may have caused pain to her arm. Reports being able to pass gas, advised patient that she can take Gas-X and can begin Miralax 1 capful mixed in a glass of water daily and can take up to 3 times a day as needed until she feels like she has emptied completely. Advised patient that since she was having a lot of diarrhea before it may take a while for her bowels to feel like they are back to "normal". Advised patient to walk as much as she can to help move the gas. Patient has not started the probiotic yet. She will call back next week with symptom update. Answered all of patient's questions, patient verbalized understanding and had no concerns at the end of the call.

## 2020-06-19 ENCOUNTER — Other Ambulatory Visit: Payer: Self-pay

## 2020-06-19 ENCOUNTER — Ambulatory Visit (INDEPENDENT_AMBULATORY_CARE_PROVIDER_SITE_OTHER): Payer: 59 | Admitting: Obstetrics & Gynecology

## 2020-06-19 ENCOUNTER — Other Ambulatory Visit (HOSPITAL_BASED_OUTPATIENT_CLINIC_OR_DEPARTMENT_OTHER): Payer: Self-pay

## 2020-06-19 ENCOUNTER — Encounter: Payer: Self-pay | Admitting: Obstetrics & Gynecology

## 2020-06-19 VITALS — BP 126/85 | HR 78 | Wt 187.0 lb

## 2020-06-19 DIAGNOSIS — N898 Other specified noninflammatory disorders of vagina: Secondary | ICD-10-CM | POA: Diagnosis not present

## 2020-06-19 DIAGNOSIS — K219 Gastro-esophageal reflux disease without esophagitis: Secondary | ICD-10-CM | POA: Diagnosis not present

## 2020-06-19 NOTE — Progress Notes (Signed)
Patient has had some bleeding that has been on going since her hysterectomy. (Sept 2021)

## 2020-06-19 NOTE — Progress Notes (Signed)
History:  30 y.o. [id number] here today for bleeding since hysterectomy. Pt is s/p TVH with bilateral salpingectomy 11/2019. She noticed pink discharge after intercourse. Pt denies pain with intercourse. Pt also eports upper GI discomfort after eating. She reports that it is not present with every meal and she does not know which food exacerbate the sx. She has taken Omeprazole 20mg  with some relief but, not complete relief.  She reports that she does not always shave a BM daily. She has a FH of GI issues and she does not want this for herself. She feel that when she is stressed from work etc, her sx are worse. She denies N/V/D.  No unexplained weight loss.    The following portions of the patient's history were reviewed and updated as appropriate: allergies, current medications, past family history, past medical history, past social history, past surgical history and problem list.  Review of Systems:  Pertinent items are noted in HPI.    Objective:  Physical Exam Blood pressure 126/85, pulse 78, weight 187 lb (84.8 kg), last menstrual period 10/16/2019.  CONSTITUTIONAL: Well-developed, well-nourished female in no acute distress.  HENT:  Normocephalic, atraumatic EYES: Conjunctivae and EOM are normal. No scleral icterus.  NECK: Normal range of motion SKIN: Skin is warm and dry. No rash noted. Not diaphoretic.No pallor. [place]: Alert and oriented to person, place, and time. Normal coordination.  Pelvic: Normal appearing external genitalia; normal appearing vaginal mucosa. There is a small area of granulation tissue on the right side of the vaginal cuff. There is no bleeding with manipulation of this area. There are no palpable masses or adnexal tenderness  The area of granulation tissue was treated with silver nitrate.    Assessment & Plan:  Granulation tissue at vaginal cuff  Treated with silver nitrate  GERD  Increase Omeprazole to 40mg  daily.  F/u if sx persist.   Quasim Doyon L.  Harraway-Smith, M.D., [facility]lynn June

## 2020-06-20 NOTE — Telephone Encounter (Signed)
Patient called back with an update. States the pain is much better and Align seem likes to be helping a lot. She would like a call back to clarify how long she should be taking the medication.

## 2020-06-20 NOTE — Telephone Encounter (Signed)
Spoke with patient, she states that she went to see her GYN and they advised that she increase her Omeprazole to 40 mg daily, patient states that she did this for about 2 days and is feeling better. Pt states that with the 40 mg she feels relief most of the day but then it seems to wear off later in the day. Advised patient that she can take Omeprazole 20 mg in the AM about 30 minutes before breakfast and about 30 minutes before dinner. Patient is aware that we sent in a 1 month supply for her to take, pt will continue align until appt. Pt states that her bowel movements have mostly returned to normal with 1 capful of Miralax daily, advised that she can decrease this to half a capful daily as needed. Pt has been scheduled for a follow up with [facility] on Wednesday, 07/12/20 at 3:30 PM. Patient verbalized understanding and had no concerns at the end of the call.

## 2020-06-22 ENCOUNTER — Ambulatory Visit (INDEPENDENT_AMBULATORY_CARE_PROVIDER_SITE_OTHER): Payer: 59

## 2020-06-22 ENCOUNTER — Other Ambulatory Visit: Payer: Self-pay

## 2020-06-22 VITALS — BP 119/78 | HR 79

## 2020-06-22 DIAGNOSIS — R319 Hematuria, unspecified: Secondary | ICD-10-CM

## 2020-06-22 LAB — POCT URINALYSIS DIPSTICK
Glucose, UA: NEGATIVE
Nitrite, UA: NEGATIVE
Protein, UA: NEGATIVE
Spec Grav, UA: 1.01 (ref 1.010–1.025)
Urobilinogen, UA: 0.2 E.U./dL
pH, UA: 5 (ref 5.0–8.0)

## 2020-06-22 NOTE — Progress Notes (Signed)
Patient complaining of some back pain and just recently had the vaginal cuff treated. Patient made aware there was a little blood in her urine but will culture and treat based off results. [name] RN

## 2020-06-24 ENCOUNTER — Emergency Department (HOSPITAL_COMMUNITY)
Admission: EM | Admit: 2020-06-24 | Discharge: 2020-06-24 | Disposition: A | Discharge status: Home / Self Care | Payer: 59 | Attending: Physician Assistant | Admitting: Physician Assistant

## 2020-06-24 ENCOUNTER — Emergency Department (HOSPITAL_COMMUNITY): Payer: 59

## 2020-06-24 DIAGNOSIS — Z5321 Procedure and treatment not carried out due to patient leaving prior to being seen by health care provider: Secondary | ICD-10-CM | POA: Insufficient documentation

## 2020-06-24 DIAGNOSIS — M545 Low back pain, unspecified: Secondary | ICD-10-CM | POA: Insufficient documentation

## 2020-06-24 DIAGNOSIS — M25551 Pain in right hip: Secondary | ICD-10-CM | POA: Insufficient documentation

## 2020-06-24 LAB — URINE CULTURE

## 2020-06-24 LAB — URINALYSIS, ROUTINE W REFLEX MICROSCOPIC
Bilirubin Urine: NEGATIVE
Glucose, UA: NEGATIVE mg/dL
Hgb urine dipstick: NEGATIVE
Ketones, ur: 20 mg/dL — AB
Nitrite: NEGATIVE
Protein, ur: NEGATIVE mg/dL
Specific Gravity, Urine: 1.011 (ref 1.005–1.030)
pH: 6 (ref 5.0–8.0)

## 2020-06-24 NOTE — ED Triage Notes (Signed)
Emergency Medicine Provider Triage Evaluation Note  Penny Klein , a 30 y.o. female  was evaluated in triage.  Pt complains of back pain, shortness of breath.  Started having back pain last week, being treated for UTI with Cipro.  Return for shortness of breath that is shortness of breath, no lip or tongue swelling, no rash  Review of Systems  Positive: Back pain Negative: Hives, angioedema  Physical Exam  BP (!) 139/101 (BP Location: Left Arm)   Pulse 85   Temp 98.2 F (36.8 C) (Oral)   Resp 16   LMP 10/16/2019   SpO2 100%  Gen:   Awake, no distress   HEENT:  Atraumatic  Resp:  Normal effort  Cardiac:  Normal rate  Abd:   Nondistended, nontender  MSK:   Moves extremities without difficulty  Neuro:  Speech clear   Medical Decision Making  Medically screening exam initiated at 7:06 PM.  Appropriate orders placed.  Penny Klein was informed that the remainder of the evaluation will be completed by another provider, this initial triage assessment does not replace that evaluation, and the importance of remaining in the ED until their evaluation is complete.  Clinical Impression  Will obtain UA and cxr to start.   Delia Heady, PA-C 06/24/20 1908

## 2020-06-24 NOTE — ED Notes (Signed)
Patient left per registration

## 2020-06-24 NOTE — ED Triage Notes (Signed)
Reports R lower back pain/hip pain that started on Wednesday.  Went to GYN and had urinalysis but hasn't received results.  Went to an [facility] on Thursday and started on Cipro.  States the Cipro was giving her SOB so she called and they told her to stop medication and come to ED.  Last dose noon today.  Denies urinary complaints at present.  Denies SOB at present.

## 2020-07-04 ENCOUNTER — Other Ambulatory Visit: Payer: Self-pay

## 2020-07-04 ENCOUNTER — Encounter: Payer: Self-pay | Admitting: Family Medicine

## 2020-07-04 ENCOUNTER — Ambulatory Visit (INDEPENDENT_AMBULATORY_CARE_PROVIDER_SITE_OTHER): Payer: 59 | Admitting: Family Medicine

## 2020-07-04 ENCOUNTER — Other Ambulatory Visit (HOSPITAL_BASED_OUTPATIENT_CLINIC_OR_DEPARTMENT_OTHER): Payer: Self-pay

## 2020-07-04 VITALS — BP 118/70 | HR 70 | Temp 97.8°F | Ht 65.0 in | Wt 185.1 lb

## 2020-07-04 DIAGNOSIS — Z0000 Encounter for general adult medical examination without abnormal findings: Secondary | ICD-10-CM

## 2020-07-04 DIAGNOSIS — Z1159 Encounter for screening for other viral diseases: Secondary | ICD-10-CM | POA: Diagnosis not present

## 2020-07-04 LAB — CBC
HCT: 40.1 % (ref 36.0–46.0)
Hemoglobin: 13.5 g/dL (ref 12.0–15.0)
MCHC: 33.7 g/dL (ref 30.0–36.0)
MCV: 88.1 fl (ref 78.0–100.0)
Platelets: 313 10*3/uL (ref 150.0–400.0)
RBC: 4.55 Mil/uL (ref 3.87–5.11)
RDW: 13.8 % (ref 11.5–15.5)
WBC: 5.6 10*3/uL (ref 4.0–10.5)

## 2020-07-04 LAB — LIPID PANEL
Cholesterol: 153 mg/dL (ref 0–200)
HDL: 63.5 mg/dL (ref >39.00)
LDL Cholesterol: 80 mg/dL (ref 0–99)
NonHDL: 89.57
Total CHOL/HDL Ratio: 2
Triglycerides: 47 mg/dL (ref 0.0–149.0)
VLDL: 9.4 mg/dL (ref 0.0–40.0)

## 2020-07-04 LAB — COMPREHENSIVE METABOLIC PANEL
ALT: 16 U/L (ref 0–35)
AST: 20 U/L (ref 0–37)
Albumin: 4.7 g/dL (ref 3.5–5.2)
Alkaline Phosphatase: 56 U/L (ref 39–117)
BUN: 10 mg/dL (ref 6–23)
CO2: 28 mEq/L (ref 19–32)
Calcium: 9.9 mg/dL (ref 8.4–10.5)
Chloride: 103 mEq/L (ref 96–112)
Creatinine, Ser: 0.68 mg/dL (ref 0.40–1.20)
GFR: 116.53 mL/min (ref >60.00)
Glucose, Bld: 80 mg/dL (ref 70–99)
Potassium: 4.2 mEq/L (ref 3.5–5.1)
Sodium: 138 mEq/L (ref 135–145)
Total Bilirubin: 0.8 mg/dL (ref 0.2–1.2)
Total Protein: 7.8 g/dL (ref 6.0–8.3)

## 2020-07-04 LAB — HEMOGLOBIN A1C: Hgb A1c MFr Bld: 5.7 % (ref 4.6–6.5)

## 2020-07-04 MED ORDER — FLUTICASONE PROPIONATE 50 MCG/ACT NA SUSP
2.0000 | Freq: Every day | NASAL | 6 refills | Status: DC
  Filled 2020-07-04 – 2021-06-13 (×2): qty 16, 30d supply, fill #0

## 2020-07-04 NOTE — Progress Notes (Signed)
Chief Complaint  Patient presents with  . New Patient (Initial Visit)     Well Woman Penny Klein is here for a complete physical.   Her last physical was >1 year ago.  Current diet: in general, a "healthy" diet. Current exercise: active at work. Weight is decreasing.  Patient's last menstrual period was 10/16/2019. Fatigue out of ordinary? No Seatbelt? Yes  Loss of interested in doing things or depression in past 2 weeks? No  Health Maintenance Tetanus- Yes HIV screening- Yes STI screening- No  Past Medical History:  Diagnosis Date  . Abnormal Pap smear of cervix 2011  . Dysmenorrhea   . Leiomyoma of uterus   . Menometrorrhagia   . Seasonal allergies      Past Surgical History:  Procedure Laterality Date  . LEEP    . VAGINAL DELIVERY    . VAGINAL HYSTERECTOMY Bilateral 11/16/2019   Procedure: HYSTERECTOMY VAGINAL WITH SALPINGECTOMY;  Surgeon: Lavonia Drafts, MD;  Location: [place];  Service: Gynecology;  Laterality: Bilateral;    Medications  Current Outpatient Medications on File Prior to Visit  Medication Sig Dispense Refill  . calcium carbonate (TUMS - DOSED IN MG ELEMENTAL CALCIUM) 500 MG chewable tablet Chew 1 tablet by mouth daily.    . nortriptyline (PAMELOR) 25 MG capsule TAKE 1 CAPSULE BY MOUTH DAILY AT BEDTIME FOR HEADACHE PREVENTION/MOOD/SLEEP 30 capsule 3  . omeprazole (PRILOSEC) 20 MG capsule Take 1 capsule (20 mg total) by mouth daily. Take 20-30 minutes before breakfast 30 capsule 0  . rizatriptan (MAXALT) 5 MG tablet TAKE 1 TABLET BY MOUTH AT ONSET OF HEADACHE, MAY REPEAT AFTER 2 HOURS AS NEEDED FOR ACUTE HEADACHES 15 tablet 1   Allergies No Known Allergies  Review of Systems: Constitutional:  no unexpected weight changes Eye:  no recent significant change in vision Ear/Nose/Mouth/Throat:  Ears:  no tinnitus or vertigo and no recent change in hearing Nose/Mouth/Throat:  no complaints of nasal congestion, no sore  throat Cardiovascular: no chest pain Respiratory:  no cough and no shortness of breath Gastrointestinal:  no abdominal pain, no change in bowel habits GU:  Female: negative for dysuria or pelvic pain Musculoskeletal/Extremities:  no pain of the joints Integumentary (Skin/Breast):  no abnormal skin lesions reported Neurologic:  no headaches Endocrine:  denies fatigue Hematologic/Lymphatic:  No areas of easy bleeding  Exam BP 118/70 (BP Location: Left Arm, Patient Position: Sitting, Cuff Size: Normal)   Pulse 70   Temp 97.8 F (36.6 C) (Oral)   Ht 5\' 5"  (1.651 m)   Wt 185 lb 2 oz (84 kg)   LMP 10/16/2019   SpO2 98%   BMI 30.81 kg/m  General:  well developed, well nourished, in no apparent distress Skin:  no significant moles, warts, or growths Head:  no masses, lesions, or tenderness Eyes:  pupils equal and round, sclera anicteric without injection Ears:  canals without lesions, TMs shiny without retraction, no obvious effusion, no erythema Nose:  nares patent, septum midline, mucosa normal, and no drainage or sinus tenderness Throat/Pharynx:  lips and gingiva without lesion; tongue and uvula midline; non-inflamed pharynx; no exudates or postnasal drainage Neck: neck supple without adenopathy, thyromegaly, or masses Lungs:  clear to auscultation, breath sounds equal bilaterally, no respiratory distress Cardio:  regular rate and rhythm, no bruits, no LE edema Abdomen:  abdomen soft, nontender; bowel sounds normal; no masses or organomegaly Genital: Defer to GYN Musculoskeletal:  symmetrical muscle groups noted without atrophy or deformity Extremities:  no clubbing,  cyanosis, or edema, no deformities, no skin discoloration Neuro:  gait normal; deep tendon reflexes normal and symmetric Psych: well oriented with normal range of affect and appropriate judgment/insight  Assessment and Plan  Well adult exam - Plan: CBC, Comprehensive metabolic panel, Lipid panel, Hemoglobin  A1c  Encounter for hepatitis C screening test for low risk patient - Plan: Hepatitis C antibody   Well 30 y.o. female. Counseled on diet and exercise. Other orders as above. Follow up in 1 yr. The patient voiced understanding and agreement to the plan.  [place], DO 07/04/20 12:12 PM

## 2020-07-04 NOTE — Patient Instructions (Signed)
Give [name] 2-3 business days to get the results of your labs back.   Keep the diet clean and stay active.  The area in your armpit is a cyst and is not anything sinister.   Let us know if you need anything.

## 2020-07-05 LAB — HEPATITIS C ANTIBODY
Hepatitis C Ab: NONREACTIVE
SIGNAL TO CUT-OFF: 0.02 (ref <1.00)

## 2020-07-07 ENCOUNTER — Other Ambulatory Visit: Payer: Self-pay | Admitting: Physician Assistant

## 2020-07-07 ENCOUNTER — Other Ambulatory Visit (HOSPITAL_BASED_OUTPATIENT_CLINIC_OR_DEPARTMENT_OTHER): Payer: Self-pay

## 2020-07-07 MED ORDER — OMEPRAZOLE 20 MG PO CPDR
20.0000 mg | DELAYED_RELEASE_CAPSULE | Freq: Every day | ORAL | 0 refills | Status: DC
  Filled 2020-07-07 – 2020-07-12 (×2): qty 30, 30d supply, fill #0

## 2020-07-11 ENCOUNTER — Other Ambulatory Visit (HOSPITAL_BASED_OUTPATIENT_CLINIC_OR_DEPARTMENT_OTHER): Payer: Self-pay

## 2020-07-12 ENCOUNTER — Telehealth: Payer: Self-pay | Admitting: Physician Assistant

## 2020-07-12 ENCOUNTER — Ambulatory Visit: Payer: 59 | Admitting: Physician Assistant

## 2020-07-12 ENCOUNTER — Encounter: Payer: Self-pay | Admitting: Family Medicine

## 2020-07-12 ENCOUNTER — Ambulatory Visit (INDEPENDENT_AMBULATORY_CARE_PROVIDER_SITE_OTHER): Payer: 59 | Admitting: Family Medicine

## 2020-07-12 ENCOUNTER — Other Ambulatory Visit (HOSPITAL_BASED_OUTPATIENT_CLINIC_OR_DEPARTMENT_OTHER): Payer: Self-pay

## 2020-07-12 ENCOUNTER — Other Ambulatory Visit: Payer: Self-pay

## 2020-07-12 VITALS — BP 112/70 | HR 77 | Temp 98.1°F | Ht 65.0 in | Wt 180.2 lb

## 2020-07-12 DIAGNOSIS — M79604 Pain in right leg: Secondary | ICD-10-CM | POA: Diagnosis not present

## 2020-07-12 DIAGNOSIS — M545 Low back pain, unspecified: Secondary | ICD-10-CM | POA: Diagnosis not present

## 2020-07-12 MED ORDER — PREDNISONE 20 MG PO TABS
ORAL_TABLET | ORAL | 0 refills | Status: DC
  Filled 2020-07-12: qty 9, 6d supply, fill #0

## 2020-07-12 MED ORDER — CYCLOBENZAPRINE HCL 10 MG PO TABS
10.0000 mg | ORAL_TABLET | Freq: Two times a day (BID) | ORAL | 0 refills | Status: DC | PRN
Start: 2020-07-12 — End: 2020-08-09
  Filled 2020-07-12: qty 30, 15d supply, fill #0

## 2020-07-12 NOTE — Telephone Encounter (Signed)
Spoke with patient, she reports that after her hysterectomy her appetite is not how it used to be. Patient states that she saw her PCP on 07/04/20 for a physical and weighed 185 lbs. She states that today she is about 180 lbs, patient is wanting to know if this amount of weight loss is okay in this time frame. She states that she does have nausea every now and then but denies any vomiting or diarrhea. Patient also requesting refill for Omeprazole 20 mg daily, advised that a refill was sent in on 07/07/20 but if she does not get it for some reason to give [name] a call and let us know. Please advise, thanks

## 2020-07-12 NOTE — Patient Instructions (Addendum)
It was nice to see you today! We will treat your back pain with flexeril as needed (this can make you drowsy, do not take before work/ driving) and prednisone for 6 days Let me know if you are not feeling better in the next few days- Sooner if worse.
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
## 2020-07-12 NOTE — Telephone Encounter (Signed)
Ellouise Newer [place], PA  [place], [place], RN Caller: Unspecified (Today, 2:04 PM) You can let her know that that is an acceptable amount of weight if she really was not eating very much for a week. Would recommend that she continue to trend this, if she continues to have problems with ongoing weight loss or symptoms then she can let us know.   Thanks, JLL   Spoke with patient in regards to recommendations. Patient will call and let us know if anything changes prior to her appointment. Patient verbalized understanding and had no concerns at the end of the call.

## 2020-07-12 NOTE — Telephone Encounter (Signed)
Inbound call from patient with questions about medication. Best contact number [phone]

## 2020-07-13 LAB — URINE CULTURE
MICRO NUMBER:: 11849343
Result:: NO GROWTH
SPECIMEN QUALITY:: ADEQUATE

## 2020-07-14 ENCOUNTER — Encounter: Payer: Self-pay | Admitting: Family Medicine

## 2020-07-17 ENCOUNTER — Emergency Department (HOSPITAL_BASED_OUTPATIENT_CLINIC_OR_DEPARTMENT_OTHER)
Admission: EM | Admit: 2020-07-17 | Discharge: 2020-07-17 | Disposition: A | Discharge status: Home / Self Care | Payer: 59 | Attending: Emergency Medicine | Admitting: Emergency Medicine

## 2020-07-17 ENCOUNTER — Encounter (HOSPITAL_BASED_OUTPATIENT_CLINIC_OR_DEPARTMENT_OTHER): Payer: Self-pay | Admitting: Emergency Medicine

## 2020-07-17 ENCOUNTER — Other Ambulatory Visit: Payer: Self-pay

## 2020-07-17 DIAGNOSIS — R102 Pelvic and perineal pain: Secondary | ICD-10-CM | POA: Diagnosis present

## 2020-07-17 LAB — URINALYSIS, ROUTINE W REFLEX MICROSCOPIC
Bilirubin Urine: NEGATIVE
Glucose, UA: NEGATIVE mg/dL
Hgb urine dipstick: NEGATIVE
Ketones, ur: NEGATIVE mg/dL
Leukocytes,Ua: NEGATIVE
Nitrite: NEGATIVE
Protein, ur: NEGATIVE mg/dL
Specific Gravity, Urine: 1.01 (ref 1.005–1.030)
pH: 7.5 (ref 5.0–8.0)

## 2020-07-17 LAB — WET PREP, GENITAL
Clue Cells Wet Prep HPF POC: NONE SEEN
Sperm: NONE SEEN
Trich, Wet Prep: NONE SEEN
WBC, Wet Prep HPF POC: NONE SEEN
Yeast Wet Prep HPF POC: NONE SEEN

## 2020-07-17 MED ORDER — IBUPROFEN 800 MG PO TABS: 800.0000 mg | ORAL_TABLET | Freq: Three times a day (TID) | ORAL | 0 refills | Status: DC | PRN

## 2020-07-17 NOTE — Discharge Instructions (Signed)

## 2020-07-17 NOTE — ED Provider Notes (Signed)
Emergency Department Provider Note   I have reviewed the triage vital signs and the nursing notes.   HISTORY  Chief Complaint Pelvic Pain   HPI Penny Klein is a 30 y.o. female with past medical history reviewed below including prior hysterectomy presents to the emergency department with lower abdominal discomfort.  Patient tells me that 3 weeks ago she completed a course of antibiotics for urinary tract infection and her urinary tract infection with symptoms resolved.  She developed some lower abdominal discomfort over the past 3 days.  She states sometimes she is feeling on the left and then it will seem to migrate over to the right.  No urine hesitancy, urgency, dysuria.  No flank pain.  No pain to the chest.  Denies any vaginal discharge. No clear modifying factors.     Past Medical History:  Diagnosis Date  . Abnormal Pap smear of cervix 2011  . Dysmenorrhea   . Leiomyoma of uterus   . Menometrorrhagia   . Seasonal allergies     Patient Active Problem List   Diagnosis Date Noted  . Post-operative state 11/16/2019  . Abnormal uterine bleeding (AUB) 11/16/2019  . Abdominal pain, left upper quadrant 05/12/2019  . Left flank pain 05/12/2019  . De Quervain's disease (tenosynovitis) 08/11/2018  . History of cervical LEEP biopsy affecting care of mother, antepartum 10/02/2017  . Vitamin D deficiency 03/17/2017  . Abnormal weight gain 12/23/2016  . Class 1 obesity due to excess calories without serious comorbidity with body mass index (BMI) of 33.0 to 33.9 in adult 12/23/2016    Past Surgical History:  Procedure Laterality Date  . ABDOMINAL HYSTERECTOMY  11/13/2019  . LEEP    . VAGINAL DELIVERY    . VAGINAL HYSTERECTOMY Bilateral 11/16/2019   Procedure: HYSTERECTOMY VAGINAL WITH SALPINGECTOMY;  Surgeon: Lavonia Drafts, MD;  Location: [place];  Service: Gynecology;  Laterality: Bilateral;    Allergies Ciprofloxacin  Family History   Problem Relation Age of Onset  . Lupus Paternal Uncle   . Hypertension Mother   . Hyperlipidemia Mother   . Obesity Mother   . Hypertension Father   . Anxiety disorder Father   . Hyperlipidemia Father   . Breast cancer Maternal Aunt   . Breast cancer Maternal Grandmother   . Stomach cancer Paternal Grandmother   . Breast cancer Maternal Aunt     Social History Social History   Tobacco Use  . Smoking status: Never Smoker  . Smokeless tobacco: Never Used  Vaping Use  . Vaping Use: Never used  Substance Use Topics  . Alcohol use: Not Currently    Comment: <1 per month  . Drug use: No    Review of Systems  Constitutional: No fever/chills Eyes: No visual changes. ENT: No sore throat. Cardiovascular: Denies chest pain. Respiratory: Denies shortness of breath. Gastrointestinal: Positive lower abdominal pain.  No nausea, no vomiting.  No diarrhea.  No constipation. Genitourinary: Negative for dysuria. Musculoskeletal: Negative for back pain. Skin: Negative for rash. Neurological: Negative for headaches, focal weakness or numbness.  10-point ROS otherwise negative.  ____________________________________________   PHYSICAL EXAM:  VITAL SIGNS: ED Triage Vitals  Enc Vitals Group     BP 07/17/20 0059 128/86     Pulse Rate 07/17/20 0059 86     Resp 07/17/20 0059 20     Temp 07/17/20 0059 98.6 F (37 C)     Temp Source 07/17/20 0059 Oral     SpO2 07/17/20 0059 100 %  Weight 07/17/20 0101 180 lb (81.6 kg)     Height 07/17/20 0101 5\' 5"  (1.651 m)   Constitutional: Alert and oriented. Well appearing and in no acute distress. Eyes: Conjunctivae are normal. Head: Atraumatic. Nose: No congestion/rhinnorhea. Mouth/Throat: Mucous membranes are moist.   Neck: No stridor.  Cardiovascular: Normal rate, regular rhythm. Good peripheral circulation. Grossly normal heart sounds.   Respiratory: Normal respiratory effort.  No retractions. Lungs CTAB. Gastrointestinal: Soft  and non-tender to deep palpation specifically in the RLQ and LLQ. No distention.  Genitourinary: Pelvic exam performed after patient's verbal consent with nurse chaperone.  Normal external genitalia.  Patient with a small amount of vaginal discharge without bleeding. No adnexal tenderness or masses on bimanual exam. Musculoskeletal: No gross deformities of extremities. Neurologic:  Normal speech and language.  Skin:  Skin is warm, dry and intact. No rash noted.   ____________________________________________   LABS (all labs ordered are listed, but only abnormal results are displayed)  Labs Reviewed  WET PREP, GENITAL  URINALYSIS, ROUTINE W REFLEX MICROSCOPIC  GC/CHLAMYDIA PROBE AMP ([facility]) NOT AT [facility]   ___________________________________  RADIOLOGY  None  ____________________________________________   PROCEDURES  Procedure(s) performed:   Procedures  None  ____________________________________________   INITIAL IMPRESSION / ASSESSMENT AND PLAN / ED COURSE  Pertinent labs & imaging results that were available during my care of the patient were reviewed by me and considered in my medical decision making (see chart for details).   Patient presents to the emergency department with lower abdominal discomfort.  Difficult to localize by history or on exam.  She is completely nontender to deep palpation in all abdominal quadrants.  Considered acute appendicitis, cholecystitis, PID, ovarian cyst.  Pelvic exam is not consistent with pelvic inflammatory disease.  She does not have tenderness to bimanual exam which I would expect in the case of acute torsion.  She has no focal lower abdominal tenderness which I would expect with a acute appendicitis or cholecystitis.  Patient has a history of hysterectomy and so pregnancy related complications are not on my differential. UA negative for infection.   Wet prep with no evidence of bacterial vaginosis or trichomoniasis.  I do not see  an indication based on exam or history to prompt emergent ultrasound to evaluate for torsion.  Patient to follow with her PCP and GYN teams as an outpatient.  She is established with both practices.  Plan for anti-inflammatory medication/Motrin.  ED return precautions discussed and provided in writing.  ____________________________________________  FINAL CLINICAL IMPRESSION(S) / ED DIAGNOSES  Final diagnoses:  Pelvic pain in female     NEW OUTPATIENT MEDICATIONS STARTED DURING THIS VISIT:  New Prescriptions   IBUPROFEN (ADVIL) 800 MG TABLET    Take 1 tablet (800 mg total) by mouth every 8 (eight) hours as needed for moderate pain.    Note:  This document was prepared using Dragon voice recognition software and may include unintentional dictation errors.  Nanda Quinton, MD, [facility] Emergency Medicine    Essa Malachi, [place], MD 07/17/20 [phone]

## 2020-07-17 NOTE — ED Triage Notes (Signed)
Reports pelvic pain since Friday.  Denies any urinary symptoms or discharge.  Recently treated for UTI.  Did complete antibiotics.   Urine recheck on Thursday showed it was clear.

## 2020-07-18 LAB — GC/CHLAMYDIA PROBE AMP (~~LOC~~) NOT AT ARMC
Chlamydia: NEGATIVE
Comment: NEGATIVE
Comment: NORMAL
Neisseria Gonorrhea: NEGATIVE

## 2020-08-09 ENCOUNTER — Encounter: Payer: Self-pay | Admitting: Physician Assistant

## 2020-08-09 ENCOUNTER — Ambulatory Visit (INDEPENDENT_AMBULATORY_CARE_PROVIDER_SITE_OTHER): Payer: 59 | Admitting: Physician Assistant

## 2020-08-09 VITALS — BP 120/80 | HR 112 | Ht 65.0 in | Wt 177.0 lb

## 2020-08-09 DIAGNOSIS — R1013 Epigastric pain: Secondary | ICD-10-CM

## 2020-08-09 DIAGNOSIS — K5900 Constipation, unspecified: Secondary | ICD-10-CM

## 2020-08-09 DIAGNOSIS — L290 Pruritus ani: Secondary | ICD-10-CM | POA: Diagnosis not present

## 2020-08-09 NOTE — Patient Instructions (Signed)
If you are age 31 or older, your body mass index should be between 23-30. Your Body mass index is 29.45 kg/m. If this is out of the aforementioned range listed, please consider follow up with your Primary Care Provider.  If you are age 78 or younger, your body mass index should be between 19-25. Your Body mass index is 29.45 kg/m. If this is out of the aformentioned range listed, please consider follow up with your Primary Care Provider.   Use your Miralax once daily.   Try using Pepcid 20 mg as needed for epigastric pain.   Use Desitin / A&D for rectal itching as needed.   Make sure to wipe clean with a wet wipe and the dab dry with toilet tissue.   The [facility] providers would like to encourage you to use [facility] to communicate with providers for non-urgent requests or questions.  Due to long hold times on the telephone, sending your provider a message by [facility] may be a faster and more efficient way to get a response.  Please allow 48 business hours for a response.  Please remember that this is for non-urgent requests.   It was a pleasure to see you today!  Thank you for trusting me with your gastrointestinal care!    Ellouise Newer, PA-C

## 2020-08-09 NOTE — Progress Notes (Signed)
Chief Complaint: Follow-up abdominal pain/constipation and new complaint of hemorrhoids  HPI:    Penny Klein is a 30 year old African-American female with past medical history as listed below, assigned to Penny Klein, who returns to clinic today for follow-up for abdominal pain, constipation and a complaint of hemorrhoids.    05/11/2019 patient seen in clinic by Penny Best, NP for left upper quadrant pain.  At that time a CBC, CMP and CRP were ordered.  As well as CTAP with contrast.  Recommended EGD as CT negative.  Start omeprazole 20 mg once daily and Zofran.    05/17/2019 CT abdomen pelvis with contrast showed prominent stool throughout the colon favoring constipation, rim calcified fundal fibroid and hypodense lesion in the left ovary is likely a cyst as well as small umbilical hernia which contains adipose tissue.    03/17/2020 CBC and BMP normal.    05/27/2020 patient called our on-call physician after hours and describe developing constipation 4 to 5 days ago.  At that time described having constipation after hysterectomy in September 2021 and use MiraLAX and drink water and ate yogurt.  Is recommended she do a bowel purge with Dulcolax 20 mg x 1 followed by 6-8 doses of MiraLAX over 2 to 3 hours.    05/29/2020 our clinic followed up with the patient she describes still having gurgling in her stomach and diarrhea.  She did not follow the bowel purge given to her by Penny Klein.  She was told to do this.    06/06/2020 patient seen in clinic and described that after doing a bowel purge she was having normal bowel movements.  She did have occasional periumbilical pain over the area of her "hernia".  At that time recommend she continue MiraLAX and referred to [facility] for further evaluation of her periumbilical hernia.    Today, the patient returns to clinic and tells me that as far as her constipation she seems to be doing okay on MiraLAX every other day, though when she does not take this  medication she does seem more constipated.  Tells me she forgot all of her doses over the weekend because they were moving and did feel uncomfortable.    Also describes some epigastric discomfort, she continues on her daily Omeprazole but will occasionally have flares of pain in the evening if she accidentally eats too late.    Most concerning for her today is that she is having some rectal itching typically after having a bowel movement.  No pain or bleeding.    Tells me she never followed with the surgeons about her hernia because she could not make the appointment.    Denies fever, chills or blood in her stool.  Past Medical History:  Diagnosis Date  . Abnormal Pap smear of cervix 2011  . Dysmenorrhea   . Leiomyoma of uterus   . Menometrorrhagia   . Seasonal allergies     Past Surgical History:  Procedure Laterality Date  . ABDOMINAL HYSTERECTOMY  11/13/2019  . LEEP    . VAGINAL DELIVERY    . VAGINAL HYSTERECTOMY Bilateral 11/16/2019   Procedure: HYSTERECTOMY VAGINAL WITH SALPINGECTOMY;  Surgeon: Lavonia Drafts, MD;  Location: [facility];  Service: Gynecology;  Laterality: Bilateral;    Current Outpatient Medications  Medication Sig Dispense Refill  . fluticasone (FLONASE) 50 MCG/ACT nasal spray Place 2 sprays into both nostrils daily. 16 g 6  . omeprazole (PRILOSEC) 20 MG capsule Take 1 capsule (20 mg total) by mouth  daily. Take 20-30 minutes before breakfast 30 capsule 0  . rizatriptan (MAXALT) 5 MG tablet TAKE 1 TABLET BY MOUTH AT ONSET OF HEADACHE, MAY REPEAT AFTER 2 HOURS AS NEEDED FOR ACUTE HEADACHES 15 tablet 1   No current facility-administered medications for this visit.    Allergies as of 08/09/2020 - Review Complete 07/17/2020  Allergen Reaction Noted  . Ciprofloxacin  07/12/2020    Family History  Problem Relation Age of Onset  . Lupus Paternal Uncle   . Hypertension Mother   . Hyperlipidemia Mother   . Obesity Mother   . Hypertension  Father   . Anxiety disorder Father   . Hyperlipidemia Father   . Breast cancer Maternal Aunt   . Breast cancer Maternal Grandmother   . Stomach cancer Paternal Grandmother   . Breast cancer Maternal Aunt     Social History   Socioeconomic History  . Marital status: Married    Spouse name: Not on file  . Number of children: Not on file  . Years of education: Not on file  . Highest education level: Not on file  Occupational History  . Not on file  Tobacco Use  . Smoking status: Never Smoker  . Smokeless tobacco: Never Used  Vaping Use  . Vaping Use: Never used  Substance and Sexual Activity  . Alcohol use: Not Currently    Comment: <1 per month  . Drug use: No  . Sexual activity: Not Currently    Birth control/protection: Surgical  Other Topics Concern  . Not on file  Social History Narrative  . Not on file   Social Determinants of Health   Financial Resource Strain: Not on file  Food Insecurity: Not on file  Transportation Needs: Not on file  Physical Activity: Not on file  Stress: Not on file  Social Connections: Not on file  Intimate Partner Violence: Not on file    Review of Systems:    Constitutional: No weight loss, fever or chills Cardiovascular: No chest pain Respiratory: No SOB  Gastrointestinal: See HPI and otherwise negative   Physical Exam:  Vital signs: BP 120/80   Pulse (!) 112   Ht 5\' 5"  (1.651 m)   Wt 177 lb (80.3 kg)   LMP 10/16/2019   BMI 29.45 kg/m   Constitutional:   Pleasant AA female appears to be in NAD, Well developed, Well nourished, alert and cooperative Respiratory: Respirations even and unlabored. Lungs clear to auscultation bilaterally.   No wheezes, crackles, or rhonchi.  Cardiovascular: Normal S1, S2. No MRG. Regular rate and rhythm. No peripheral edema, cyanosis or pallor.  Gastrointestinal:  Soft, nondistended, nontender. No rebound or guarding. Normal bowel sounds. No appreciable masses or hepatomegaly. Rectal:  External: No visible hemorrhoids, some moisture and excess hair; internal: No palpable hemorrhoids or abnormality Psychiatric:Demonstrates good judgement and reason without abnormal affect or behaviors.  RELEVANT LABS AND IMAGING: CBC    Component Value Date/Time   WBC 5.6 07/04/2020 1024   RBC 4.55 07/04/2020 1024   HGB 13.5 07/04/2020 1024   HGB 12.4 07/30/2017 0855   HCT 40.1 07/04/2020 1024   HCT 36.2 07/30/2017 0855   PLT 313.0 07/04/2020 1024   PLT 252 07/30/2017 0855   MCV 88.1 07/04/2020 1024   MCV 88 07/30/2017 0855   MCH 28.7 03/17/2020 2313   MCHC 33.7 07/04/2020 1024   RDW 13.8 07/04/2020 1024   RDW 14.4 07/30/2017 0855   LYMPHSABS 1.9 05/13/2019 1151   LYMPHSABS 2.0 03/14/2017 0955  MONOABS 0.3 05/13/2019 1151   EOSABS 0.1 05/13/2019 1151   EOSABS 0.1 03/14/2017 0955   BASOSABS 0.0 05/13/2019 1151   BASOSABS 0.0 03/14/2017 0955    CMP     Component Value Date/Time   NA 138 07/04/2020 1024   K 4.2 07/04/2020 1024   CL 103 07/04/2020 1024   CO2 28 07/04/2020 1024   GLUCOSE 80 07/04/2020 1024   BUN 10 07/04/2020 1024   CREATININE 0.68 07/04/2020 1024   CALCIUM 9.9 07/04/2020 1024   PROT 7.8 07/04/2020 1024   ALBUMIN 4.7 07/04/2020 1024   AST 20 07/04/2020 1024   ALT 16 07/04/2020 1024   ALKPHOS 56 07/04/2020 1024   BILITOT 0.8 07/04/2020 1024   GFRNONAA >60 03/17/2020 2313   GFRAA >60 01/02/2017 0959    Assessment: 1.  Constipation: Better when she uses MiraLAX 2.  Epigastric discomfort: Likely related to her known gastritis/GERD 3.  Rectal itching: Likely from moisture/hair in this area, no visible hemorrhoids  Plan: 1.  Discussed with patient I would recommend wiping with a [place] wet wipe after a bowel movement and dabbing dry with toilet paper.  She can then apply A&D or Desitin barrier ointment, a small amount to the area. 2.  Would recommend she use her MiraLAX on a daily basis 3.  Continue Omeprazole and use Pepcid OTC 20 mg as needed  for breakthrough symptoms 4.  Patient to follow with [name] as needed in the future.  Penny Newer, PA-C [facility] 08/09/2020, 9:36 AM  Cc: [name]*

## 2020-08-11 NOTE — Progress Notes (Signed)
Agree with the assessment and plan as outlined by Jennifer Lemmon, PA-C. ? ?Hali Balgobin, DO, FACG ? ?

## 2020-08-16 ENCOUNTER — Other Ambulatory Visit (HOSPITAL_BASED_OUTPATIENT_CLINIC_OR_DEPARTMENT_OTHER): Payer: Self-pay

## 2020-09-13 ENCOUNTER — Ambulatory Visit: Payer: 59 | Admitting: Family Medicine

## 2020-12-11 ENCOUNTER — Other Ambulatory Visit: Payer: Self-pay

## 2020-12-11 DIAGNOSIS — Z5321 Procedure and treatment not carried out due to patient leaving prior to being seen by health care provider: Secondary | ICD-10-CM | POA: Insufficient documentation

## 2020-12-11 DIAGNOSIS — R0602 Shortness of breath: Secondary | ICD-10-CM | POA: Insufficient documentation

## 2020-12-11 NOTE — ED Triage Notes (Signed)
Pt c/o shortness of breath, but states it may be from her congestion. NAD during triage. SpO2 100% on RA

## 2020-12-12 ENCOUNTER — Ambulatory Visit (INDEPENDENT_AMBULATORY_CARE_PROVIDER_SITE_OTHER): Payer: 59 | Admitting: Family

## 2020-12-12 ENCOUNTER — Emergency Department (HOSPITAL_BASED_OUTPATIENT_CLINIC_OR_DEPARTMENT_OTHER)
Admission: EM | Admit: 2020-12-12 | Discharge: 2020-12-12 | Disposition: A | Discharge status: Home / Self Care | Payer: 59 | Attending: Emergency Medicine | Admitting: Emergency Medicine

## 2020-12-12 DIAGNOSIS — J3489 Other specified disorders of nose and nasal sinuses: Secondary | ICD-10-CM | POA: Diagnosis not present

## 2020-12-12 DIAGNOSIS — R0981 Nasal congestion: Secondary | ICD-10-CM

## 2020-12-12 NOTE — Progress Notes (Signed)
Acute Office Visit  Subjective:    Patient ID: Penny Klein, female    DOB: 1990/04/08, 30 y.o.   MRN: [id number]  Chief Complaint  Patient presents with   Nasal Congestion and drainage  (No Covid test)    Pt says the drainage is causing some nausea. Started about 2 weeks ago. Some cough. Has tried thera-flu. No covid test at home.    HPI Patient is in today for nasal drainage x 2 weeks, postnasal drainage, mild cough, nausea which she thinks is coming from swallowing secretions.    Outpatient Medications Prior to Visit  Medication Sig Dispense Refill   fluticasone (FLONASE) 50 MCG/ACT nasal spray Place 2 sprays into both nostrils daily. 16 g 6   rizatriptan (MAXALT) 5 MG tablet TAKE 1 TABLET BY MOUTH AT ONSET OF HEADACHE, MAY REPEAT AFTER 2 HOURS AS NEEDED FOR ACUTE HEADACHES 15 tablet 1   omeprazole (PRILOSEC) 20 MG capsule Take 1 capsule (20 mg total) by mouth daily. Take 20-30 minutes before breakfast (Patient not taking: Reported on 12/12/2020) 30 capsule 0   No facility-administered medications prior to visit.    Allergies  Allergen Reactions   Ciprofloxacin     SHOB    Review of Systems  All other systems reviewed and are negative.     Objective:    Physical Exam Vitals and nursing note reviewed.  Constitutional:      Appearance: Normal appearance.  HENT:     Right Ear: Tympanic membrane and external ear normal.     Left Ear: Tympanic membrane and external ear normal.     Nose: Congestion and rhinorrhea present. Rhinorrhea is clear.     Right Sinus: No frontal sinus tenderness.     Left Sinus: No frontal sinus tenderness.     Mouth/Throat:     Mouth: Mucous membranes are moist.     Pharynx: No posterior oropharyngeal erythema.     Comments: Postnasal drip/clear mucus noted in oropharynx Cardiovascular:     Rate and Rhythm: Normal rate and regular rhythm.  Pulmonary:     Effort: Pulmonary effort is normal.     Breath sounds: Normal breath sounds.   Musculoskeletal:        General: Normal range of motion.  Skin:    General: Skin is warm and dry.    BP 120/80 (BP Location: Left Arm, Patient Position: Sitting, Cuff Size: Large)   Pulse 82   Temp 98.2 F (36.8 C) (Oral)   Resp 18   Ht 5\' 5"  (1.651 m)   Wt 180 lb 3.2 oz (81.7 kg)   LMP 10/16/2019   SpO2 100%   BMI 29.99 kg/m  Wt Readings from Last 3 Encounters:  12/12/20 180 lb 3.2 oz (81.7 kg)  12/11/20 180 lb (81.6 kg)  08/09/20 177 lb (80.3 kg)    Health Maintenance Due  Topic Date Due   COVID-19 Vaccine (3 - Booster for Moderna series) 12/06/2019   INFLUENZA VACCINE  10/09/2020    There are no preventive care reminders to display for this patient.        Assessment & Plan:   Problem List Items Addressed This Visit       Other   Nasal congestion with rhinorrhea    2 weeks of sx, had sinus pain/pressure which has resolved, rhinorrhea continuing. Restarting Flonase and Zyrtec.        No orders of the defined types were placed in this encounter.    Jeanie Sewer, NP

## 2020-12-12 NOTE — Assessment & Plan Note (Addendum)
2 weeks of sx, had sinus pain/pressure which has resolved, rhinorrhea continuing. Restarting Flonase and Zyrtec. Time spent (20 minutes)  reviewing s/s, treatment of allergic rhinitis.

## 2020-12-12 NOTE — Patient Instructions (Signed)
Restart Flonase nasal spray, 2 sprays in am, or 2 sprays in am and pm for 1 week, then reduce back to 2 sprays in am. Can also add generic Zyrtec or Claritin daily (am or pm). Increase water intake to at least 2 liters per day.

## 2021-01-17 ENCOUNTER — Ambulatory Visit (INDEPENDENT_AMBULATORY_CARE_PROVIDER_SITE_OTHER): Payer: 59

## 2021-01-17 ENCOUNTER — Other Ambulatory Visit: Payer: Self-pay

## 2021-01-17 ENCOUNTER — Other Ambulatory Visit (HOSPITAL_COMMUNITY)
Admission: RE | Admit: 2021-01-17 | Discharge: 2021-01-17 | Disposition: A | Discharge status: Home / Self Care | Payer: 59 | Source: Ambulatory Visit | Attending: Obstetrics & Gynecology | Admitting: Obstetrics & Gynecology

## 2021-01-17 VITALS — BP 119/76 | HR 71 | Wt 181.0 lb

## 2021-01-17 DIAGNOSIS — N898 Other specified noninflammatory disorders of vagina: Secondary | ICD-10-CM

## 2021-01-17 DIAGNOSIS — R399 Unspecified symptoms and signs involving the genitourinary system: Secondary | ICD-10-CM

## 2021-01-17 LAB — POCT URINALYSIS DIPSTICK
Bilirubin, UA: NEGATIVE
Glucose, UA: NEGATIVE
Ketones, UA: NEGATIVE
Nitrite, UA: NEGATIVE
Protein, UA: NEGATIVE
Spec Grav, UA: 1.02 (ref 1.010–1.025)
Urobilinogen, UA: 0.2 E.U./dL
pH, UA: 6.5 (ref 5.0–8.0)

## 2021-01-17 NOTE — Progress Notes (Addendum)
SUBJECTIVE:  30 y.o. female complains of white vaginal discharge for 6  day(s). Denies abnormal vaginal bleeding or significant pelvic pain or fever. No UTI symptoms. Denies history of known exposure to STD.  Patient's last menstrual period was 10/16/2019.  OBJECTIVE:  She appears well, afebrile. Urine dipstick: positive for WBC's and RBC's.  ASSESSMENT:  Vaginal Discharge     PLAN:  Urine culture, GC, chlamydia, trichomonas, BVAG, CVAG probe sent to lab. Treatment: To be determined once lab results are received ROV prn if symptoms persist or worsen.  Slayden Mennenga l Dotti Busey, CMA     Attestation of Attending Supervision of CMA/RN: Evaluation and management procedures were performed by the nurse under my supervision and collaboration.  I have reviewed the nursing note and chart, and I agree with the management and plan.  Carolyn L. Harraway-Smith, M.D., Cherlynn June

## 2021-01-17 NOTE — Addendum Note (Signed)
Addended by: Valentina Lucks on: 01/17/2021 03:27 PM   Modules accepted: Orders

## 2021-01-19 LAB — CERVICOVAGINAL ANCILLARY ONLY
Bacterial Vaginitis (gardnerella): POSITIVE — AB
Candida Glabrata: NEGATIVE
Candida Vaginitis: POSITIVE — AB
Chlamydia: NEGATIVE
Comment: NEGATIVE
Comment: NEGATIVE
Comment: NEGATIVE
Comment: NEGATIVE
Comment: NEGATIVE
Comment: NORMAL
Neisseria Gonorrhea: NEGATIVE
Trichomonas: NEGATIVE

## 2021-01-22 ENCOUNTER — Other Ambulatory Visit (HOSPITAL_BASED_OUTPATIENT_CLINIC_OR_DEPARTMENT_OTHER): Payer: Self-pay

## 2021-01-22 ENCOUNTER — Other Ambulatory Visit: Payer: Self-pay | Admitting: Obstetrics & Gynecology

## 2021-01-22 LAB — URINE CULTURE

## 2021-01-22 MED ORDER — METRONIDAZOLE 500 MG PO TABS
500.0000 mg | ORAL_TABLET | Freq: Two times a day (BID) | ORAL | 0 refills | Status: DC
  Filled 2021-01-22: qty 14, 7d supply, fill #0

## 2021-01-22 MED ORDER — TERCONAZOLE 0.8 % VA CREA
1.0000 | TOPICAL_CREAM | Freq: Every day | VAGINAL | 0 refills | Status: DC
  Filled 2021-01-22: qty 20, 3d supply, fill #0

## 2021-01-22 NOTE — Addendum Note (Signed)
Addended by: Lavonia Drafts on: 01/22/2021 03:24 PM   Modules accepted: Orders

## 2021-02-09 ENCOUNTER — Other Ambulatory Visit (HOSPITAL_BASED_OUTPATIENT_CLINIC_OR_DEPARTMENT_OTHER): Payer: Self-pay

## 2021-02-09 ENCOUNTER — Encounter: Payer: Self-pay | Admitting: Family Medicine

## 2021-02-09 ENCOUNTER — Ambulatory Visit (INDEPENDENT_AMBULATORY_CARE_PROVIDER_SITE_OTHER): Payer: 59 | Admitting: Family Medicine

## 2021-02-09 VITALS — BP 108/68 | HR 78 | Temp 98.4°F | Ht 65.0 in | Wt 180.0 lb

## 2021-02-09 DIAGNOSIS — L729 Follicular cyst of the skin and subcutaneous tissue, unspecified: Secondary | ICD-10-CM | POA: Diagnosis not present

## 2021-02-09 DIAGNOSIS — L089 Local infection of the skin and subcutaneous tissue, unspecified: Secondary | ICD-10-CM

## 2021-02-09 DIAGNOSIS — R42 Dizziness and giddiness: Secondary | ICD-10-CM

## 2021-02-09 DIAGNOSIS — R002 Palpitations: Secondary | ICD-10-CM | POA: Diagnosis not present

## 2021-02-09 MED ORDER — FLUCONAZOLE 150 MG PO TABS
ORAL_TABLET | ORAL | 0 refills | Status: DC
  Filled 2021-02-09: qty 2, 3d supply, fill #0

## 2021-02-09 MED ORDER — CEPHALEXIN 500 MG PO CAPS
500.0000 mg | ORAL_CAPSULE | Freq: Three times a day (TID) | ORAL | 0 refills | Status: AC
Start: 2021-02-09 — End: 2021-02-16
  Filled 2021-02-09: qty 21, 7d supply, fill #0

## 2021-02-09 NOTE — Addendum Note (Signed)
Addended by: Kelle Darting A on: 02/09/2021 03:21 PM   Modules accepted: Orders

## 2021-02-09 NOTE — Progress Notes (Signed)
CC: Lightheadedness   Penny Klein is 30 y.o. pt here for dizziness.  Duration: 12 days Pass out? No Spinning? No Recent illness/fever? No Headache? No Neurologic signs? No Change in PO intake? No Palpitations? Sometimes yes; lasted intermittently for an hr; has been drinking more coffee  Skin lesion Duration: 1 week Location: R armpit, a lump Pruritic? No Painful? Yes from pressure of arm on it Drainage? Slight amount initially but none lately Had similar recurrent issues on L side. Less so since losing wt.  New soaps/lotions/topicals/detergents? No Other associated symptoms: red at first Therapies tried thus far: hot water  Past Medical History:  Diagnosis Date   Abnormal Pap smear of cervix 2011   Dysmenorrhea    Leiomyoma of uterus    Menometrorrhagia    Seasonal allergies     Family History  Problem Relation Age of Onset   Lupus Paternal Uncle    Hypertension Mother    Hyperlipidemia Mother    Obesity Mother    Hypertension Father    Anxiety disorder Father    Hyperlipidemia Father    Breast cancer Maternal Aunt    Breast cancer Maternal Grandmother    Stomach cancer Paternal Grandmother    Breast cancer Maternal Aunt     Allergies as of 02/09/2021       Reactions   Ciprofloxacin    SHOB        Medication List        Accurate as of February 09, 2021  1:59 PM. If you have any questions, ask your nurse or doctor.          cephALEXin 500 MG capsule Commonly known as: KEFLEX Take 1 capsule (500 mg total) by mouth 3 (three) times daily for 7 days. Started by: [name], DO   fluconazole 150 MG tablet Commonly known as: DIFLUCAN Take 1 tablet by mouth as directed, repeat in 72 hours if no improvement. Started by: [name], DO   fluticasone 50 MCG/ACT nasal spray Commonly known as: FLONASE Place 2 sprays into both nostrils daily.   metroNIDAZOLE 500 MG tablet Commonly known as: FLAGYL Take 1 tablet (500 mg  total) by mouth 2 (two) times daily.   omeprazole 40 MG capsule Commonly known as: PRILOSEC Take 40 mg by mouth daily.   rizatriptan 5 MG tablet Commonly known as: MAXALT TAKE 1 TABLET BY MOUTH AT ONSET OF HEADACHE, MAY REPEAT AFTER 2 HOURS AS NEEDED FOR ACUTE HEADACHES   terconazole 0.8 % vaginal cream Commonly known as: TERAZOL 3 Place 1 applicator vaginally at bedtime. Apply nightly for three nights.        BP 108/68   Pulse 78   Temp 98.4 F (36.9 C) (Oral)   Ht 5\' 5"  (1.651 m)   Wt 180 lb (81.6 kg)   LMP 10/16/2019   SpO2 99%   BMI 29.95 kg/m  General: Awake, alert, appears stated age Eyes: PERRLA, EOMi Skin: In the right upper axillary region, there is a circular and indurated lesion measuring approximately 2.5 cm in diameter.  There is no fluctuance or drainage.  There is no erythema or pigmentation changes noted. Ears: Patent, TM's neg b/l Heart: RRR, no carotid bruits Lungs: CTAB, no accessory muscle use MSK: 5/5 strength throughout, gait normal Neuro: No cerebellar signs, patellar reflex 2/4 b/l wo clonus, calcaneal reflex 0/4 b/l wo clonus, biceps reflex 1/4 b/l wo clonus Psych: Age appropriate judgment and insight, normal mood and affect  Lightheaded  Palpitations -  Plan: TSH, T4, free, CBC, Comprehensive metabolic panel, Magnesium  Infected cyst of skin - Plan: fluconazole (DIFLUCAN) 150 MG tablet, cephALEXin (KEFLEX) 500 MG capsule  1/2.  New problem, uncertain current prognosis.  Could be related to hydration status and new intake of caffeine.  We will check labs.  Doubt arrhythmia or structural abnormality. 3.  Could be hidradenitis suppurativa, but seeing however this is the first time it has happened on the right side, will send in 7 days of Keflex with Diflucan should she develop a yeast infection.  Ice, Tylenol, ibuprofen. F/u prn. Pt voiced understanding and agreement to the plan.  [place], DO 02/09/21 1:59 PM

## 2021-02-09 NOTE — Patient Instructions (Signed)
Give [name] 2-3 business days to get the results of your labs back.   Stay hydrated. Caffeine is a natural diuretic and can make you pee more. It can also cause the fluttering in your chest.  Let us know if you need anything.  EXERCISES  RANGE OF MOTION (ROM) AND STRETCHING EXERCISES - Low Back Pain Most people with lower back pain will find that their symptoms get worse with excessive bending forward (flexion) or arching at the lower back (extension). The exercises that will help resolve your symptoms will focus on the opposite motion.  If you have pain, numbness or tingling which travels down into your buttocks, leg or foot, the goal of the therapy is for these symptoms to move closer to your back and eventually resolve. Sometimes, these leg symptoms will get better, but your lower back pain may worsen. This is often an indication of progress in your rehabilitation. Be very alert to any changes in your symptoms and the activities in which you participated in the 24 hours prior to the change. Sharing this information with your caregiver will allow him or her to most efficiently treat your condition. These exercises may help you when beginning to rehabilitate your injury. Your symptoms may resolve with or without further involvement from your physician, physical therapist or athletic trainer. While completing these exercises, remember:  Restoring tissue flexibility helps normal motion to return to the joints. This allows healthier, less painful movement and activity. An effective stretch should be held for at least 30 seconds. A stretch should never be painful. You should only feel a gentle lengthening or release in the stretched tissue. FLEXION RANGE OF MOTION AND STRETCHING EXERCISES:  STRETCH - Flexion, Single Knee to Chest  Lie on a firm bed or floor with both legs extended in front of you. Keeping one leg in contact with the floor, bring your opposite knee to your chest. Hold your leg in place by  either grabbing behind your thigh or at your knee. Pull until you feel a gentle stretch in your low back. Hold 30 seconds. Slowly release your grasp and repeat the exercise with the opposite side. Repeat 2 times. Complete this exercise 3 times per week.   STRETCH - Flexion, Double Knee to Chest Lie on a firm bed or floor with both legs extended in front of you. Keeping one leg in contact with the floor, bring your opposite knee to your chest. Tense your stomach muscles to support your back and then lift your other knee to your chest. Hold your legs in place by either grabbing behind your thighs or at your knees. Pull both knees toward your chest until you feel a gentle stretch in your low back. Hold 30 seconds. Tense your stomach muscles and slowly return one leg at a time to the floor. Repeat 2 times. Complete this exercise 3 times per week.   STRETCH - Low Trunk Rotation Lie on a firm bed or floor. Keeping your legs in front of you, bend your knees so they are both pointed toward the ceiling and your feet are flat on the floor. Extend your arms out to the side. This will stabilize your upper body by keeping your shoulders in contact with the floor. Gently and slowly drop both knees together to one side until you feel a gentle stretch in your low back. Hold for 30 seconds. Tense your stomach muscles to support your lower back as you bring your knees back to the starting position. Repeat  the exercise to the other side. Repeat 2 times. Complete this exercise at least 3 times per week.   EXTENSION RANGE OF MOTION AND FLEXIBILITY EXERCISES:  STRETCH - Extension, Prone on Elbows  Lie on your stomach on the floor, a bed will be too soft. Place your palms about shoulder width apart and at the height of your head. Place your elbows under your shoulders. If this is too painful, stack pillows under your chest. Allow your body to relax so that your hips drop lower and make contact more completely with  the floor. Hold this position for 30 seconds. Slowly return to lying flat on the floor. Repeat 2 times. Complete this exercise 3 times per week.   RANGE OF MOTION - Extension, Prone Press Ups Lie on your stomach on the floor, a bed will be too soft. Place your palms about shoulder width apart and at the height of your head. Keeping your back as relaxed as possible, slowly straighten your elbows while keeping your hips on the floor. You may adjust the placement of your hands to maximize your comfort. As you gain motion, your hands will come more underneath your shoulders. Hold this position 30 seconds. Slowly return to lying flat on the floor. Repeat 2 times. Complete this exercise 3 times per week.   RANGE OF MOTION- Quadruped, Neutral Spine  Assume a hands and knees position on a firm surface. Keep your hands under your shoulders and your knees under your hips. You may place padding under your knees for comfort. Drop your head and point your tailbone toward the ground below you. This will round out your lower back like an angry cat. Hold this position for 30 seconds. Slowly lift your head and release your tail bone so that your back sags into a large arch, like an old horse. Hold this position for 30 seconds. Repeat this until you feel limber in your low back. Now, find your "sweet spot." This will be the most comfortable position somewhere between the two previous positions. This is your neutral spine. Once you have found this position, tense your stomach muscles to support your low back. Hold this position for 30 seconds. Repeat 2 times. Complete this exercise 3 times per week.   STRENGTHENING EXERCISES - Low Back Sprain These exercises may help you when beginning to rehabilitate your injury. These exercises should be done near your "sweet spot." This is the neutral, low-back arch, somewhere between fully rounded and fully arched, that is your least painful position. When performed in this  safe range of motion, these exercises can be used for people who have either a flexion or extension based injury. These exercises may resolve your symptoms with or without further involvement from your physician, physical therapist or athletic trainer. While completing these exercises, remember:  Muscles can gain both the endurance and the strength needed for everyday activities through controlled exercises. Complete these exercises as instructed by your physician, physical therapist or athletic trainer. Increase the resistance and repetitions only as guided. You may experience muscle soreness or fatigue, but the pain or discomfort you are trying to eliminate should never worsen during these exercises. If this pain does worsen, stop and make certain you are following the directions exactly. If the pain is still present after adjustments, discontinue the exercise until you can discuss the trouble with your caregiver.  STRENGTHENING - Deep Abdominals, Pelvic Tilt  Lie on a firm bed or floor. Keeping your legs in front of you, bend  your knees so they are both pointed toward the ceiling and your feet are flat on the floor. Tense your lower abdominal muscles to press your low back into the floor. This motion will rotate your pelvis so that your tail bone is scooping upwards rather than pointing at your feet or into the floor. With a gentle tension and even breathing, hold this position for 3 seconds. Repeat 2 times. Complete this exercise 3 times per week.   STRENGTHENING - Abdominals, Crunches  Lie on a firm bed or floor. Keeping your legs in front of you, bend your knees so they are both pointed toward the ceiling and your feet are flat on the floor. Cross your arms over your chest. Slightly tip your chin down without bending your neck. Tense your abdominals and slowly lift your trunk high enough to just clear your shoulder blades. Lifting higher can put excessive stress on the lower back and does not  further strengthen your abdominal muscles. Control your return to the starting position. Repeat 2 times. Complete this exercise 3 times per week.   STRENGTHENING - Quadruped, Opposite UE/LE Lift  Assume a hands and knees position on a firm surface. Keep your hands under your shoulders and your knees under your hips. You may place padding under your knees for comfort. Find your neutral spine and gently tense your abdominal muscles so that you can maintain this position. Your shoulders and hips should form a rectangle that is parallel with the floor and is not twisted. Keeping your trunk steady, lift your right hand no higher than your shoulder and then your left leg no higher than your hip. Make sure you are not holding your breath. Hold this position for 30 seconds. Continuing to keep your abdominal muscles tense and your back steady, slowly return to your starting position. Repeat with the opposite arm and leg. Repeat 2 times. Complete this exercise 3 times per week.   STRENGTHENING - Abdominals and Quadriceps, Straight Leg Raise  Lie on a firm bed or floor with both legs extended in front of you. Keeping one leg in contact with the floor, bend the other knee so that your foot can rest flat on the floor. Find your neutral spine, and tense your abdominal muscles to maintain your spinal position throughout the exercise. Slowly lift your straight leg off the floor about 6 inches for a count of 3, making sure to not hold your breath. Still keeping your neutral spine, slowly lower your leg all the way to the floor. Repeat this exercise with each leg 2 times. Complete this exercise 3 times per week.  POSTURE AND BODY MECHANICS CONSIDERATIONS - Low Back Sprain Keeping correct posture when sitting, standing or completing your activities will reduce the stress put on different body tissues, allowing injured tissues a chance to heal and limiting painful experiences. The following are general guidelines  for improved posture.  While reading these guidelines, remember: The exercises prescribed by your provider will help you have the flexibility and strength to maintain correct postures. The correct posture provides the best environment for your joints to work. All of your joints have less wear and tear when properly supported by a spine with good posture. This means you will experience a healthier, less painful body. Correct posture must be practiced with all of your activities, especially prolonged sitting and standing. Correct posture is as important when doing repetitive low-stress activities (typing) as it is when doing a single heavy-load activity (lifting).  RESTING  POSITIONS Consider which positions are most painful for you when choosing a resting position. If you have pain with flexion-based activities (sitting, bending, stooping, squatting), choose a position that allows you to rest in a less flexed posture. You would want to avoid curling into a fetal position on your side. If your pain worsens with extension-based activities (prolonged standing, working overhead), avoid resting in an extended position such as sleeping on your stomach. Most people will find more comfort when they rest with their spine in a more neutral position, neither too rounded nor too arched. Lying on a non-sagging bed on your side with a pillow between your knees, or on your back with a pillow under your knees will often provide some relief. Keep in mind, being in any one position for a prolonged period of time, no matter how correct your posture, can still lead to stiffness.  PROPER SITTING POSTURE In order to minimize stress and discomfort on your spine, you must sit with correct posture. Sitting with good posture should be effortless for a healthy body. Returning to good posture is a gradual process. Many people can work toward this most comfortably by using various supports until they have the flexibility and strength to  maintain this posture on their own. When sitting with proper posture, your ears will fall over your shoulders and your shoulders will fall over your hips. You should use the back of the chair to support your upper back. Your lower back will be in a neutral position, just slightly arched. You may place a small pillow or folded towel at the base of your lower back for  support.  When working at a desk, create an environment that supports good, upright posture. Without extra support, muscles tire, which leads to excessive strain on joints and other tissues. Keep these recommendations in mind:  CHAIR: A chair should be able to slide under your desk when your back makes contact with the back of the chair. This allows you to work closely. The chair's height should allow your eyes to be level with the upper part of your monitor and your hands to be slightly lower than your elbows.  BODY POSITION Your feet should make contact with the floor. If this is not possible, use a foot rest. Keep your ears over your shoulders. This will reduce stress on your neck and low back.  INCORRECT SITTING POSTURES  If you are feeling tired and unable to assume a healthy sitting posture, do not slouch or slump. This puts excessive strain on your back tissues, causing more damage and pain. Healthier options include: Using more support, like a lumbar pillow. Switching tasks to something that requires you to be upright or walking. Talking a brief walk. Lying down to rest in a neutral-spine position.  PROLONGED STANDING WHILE SLIGHTLY LEANING FORWARD  When completing a task that requires you to lean forward while standing in one place for a long time, place either foot up on a stationary 2-4 inch high object to help maintain the best posture. When both feet are on the ground, the lower back tends to lose its slight inward curve. If this curve flattens (or becomes too large), then the back and your other joints will experience  too much stress, tire more quickly, and can cause pain.  CORRECT STANDING POSTURES Proper standing posture should be assumed with all daily activities, even if they only take a few moments, like when brushing your teeth. As in sitting, your ears should fall  over your shoulders and your shoulders should fall over your hips. You should keep a slight tension in your abdominal muscles to brace your spine. Your tailbone should point down to the ground, not behind your body, resulting in an over-extended swayback posture.   INCORRECT STANDING POSTURES  Common incorrect standing postures include a forward head, locked knees and/or an excessive swayback. WALKING Walk with an upright posture. Your ears, shoulders and hips should all line-up.  PROLONGED ACTIVITY IN A FLEXED POSITION When completing a task that requires you to bend forward at your waist or lean over a low surface, try to find a way to stabilize 3 out of 4 of your limbs. You can place a hand or elbow on your thigh or rest a knee on the surface you are reaching across. This will provide you more stability, so that your muscles do not tire as quickly. By keeping your knees relaxed, or slightly bent, you will also reduce stress across your lower back. CORRECT LIFTING TECHNIQUES  DO : Assume a wide stance. This will provide you more stability and the opportunity to get as close as possible to the object which you are lifting. Tense your abdominals to brace your spine. Bend at the knees and hips. Keeping your back locked in a neutral-spine position, lift using your leg muscles. Lift with your legs, keeping your back straight. Test the weight of unknown objects before attempting to lift them. Try to keep your elbows locked down at your sides in order get the best strength from your shoulders when carrying an object.   Always ask for help when lifting heavy or awkward objects. INCORRECT LIFTING TECHNIQUES DO NOT:  Lock your knees when lifting,  even if it is a small object. Bend and twist. Pivot at your feet or move your feet when needing to change directions. Assume that you can safely pick up even a paperclip without proper posture.

## 2021-02-10 LAB — COMPREHENSIVE METABOLIC PANEL
AG Ratio: 1.5 (calc) (ref 1.0–2.5)
ALT: 15 U/L (ref 6–29)
AST: 17 U/L (ref 10–30)
Albumin: 4.6 g/dL (ref 3.6–5.1)
Alkaline phosphatase (APISO): 66 U/L (ref 31–125)
BUN: 11 mg/dL (ref 7–25)
CO2: 22 mmol/L (ref 20–32)
Calcium: 9.3 mg/dL (ref 8.6–10.2)
Chloride: 102 mmol/L (ref 98–110)
Creat: 0.61 mg/dL (ref 0.50–0.97)
Globulin: 3 g/dL (calc) (ref 1.9–3.7)
Glucose, Bld: 80 mg/dL (ref 65–99)
Potassium: 4.5 mmol/L (ref 3.5–5.3)
Sodium: 138 mmol/L (ref 135–146)
Total Bilirubin: 0.5 mg/dL (ref 0.2–1.2)
Total Protein: 7.6 g/dL (ref 6.1–8.1)

## 2021-02-10 LAB — CBC
HCT: 41.7 % (ref 35.0–45.0)
Hemoglobin: 13.8 g/dL (ref 11.7–15.5)
MCH: 29.6 pg (ref 27.0–33.0)
MCHC: 33.1 g/dL (ref 32.0–36.0)
MCV: 89.5 fL (ref 80.0–100.0)
MPV: 10.4 fL (ref 7.5–12.5)
Platelets: 292 10*3/uL (ref 140–400)
RBC: 4.66 10*6/uL (ref 3.80–5.10)
RDW: 12.6 % (ref 11.0–15.0)
WBC: 8 10*3/uL (ref 3.8–10.8)

## 2021-02-10 LAB — MAGNESIUM: Magnesium: 2.1 mg/dL (ref 1.5–2.5)

## 2021-02-10 LAB — T4, FREE: Free T4: 1 ng/dL (ref 0.8–1.8)

## 2021-02-10 LAB — TSH: TSH: 3.87 mIU/L

## 2021-02-12 ENCOUNTER — Telehealth: Payer: Self-pay | Admitting: *Deleted

## 2021-02-12 NOTE — Telephone Encounter (Signed)
Who Is Calling Patient / Member / Family / Caregiver Call Type Triage / Clinical Relationship To Patient Self Return Phone Number [phone] (Primary) Chief Complaint Lab Result Reason for Call Symptomatic / Request for Health Information Initial Comment Caller states she wants to know if her test results are ok and is freaking out about one of them. She had thyroid blood work done and wants to know if the value is in range. Translation No Nurse Assessment Nurse: Gildardo Pounds, RN, Amy Date/Time (Eastern Time): 02/10/2021 8:36:53 AM Confirm and document reason for call. If symptomatic, describe symptoms. ---Caller states she wants to know if her test results are okay. She had thyroid blood work done and wants to know if the value is in range. She has a family history of thyroid issues. The doctor prescribed Keflex TID for cysts under her arm. What side effects are there & is there anything she should be doing while taking it, like a probiotic? Does the patient have any new or worsening symptoms? ---No Please document clinical information provided and list any resource used. ---Caller states her TSH is 3.8. Advised that this is in the normal range. ([email or web address]). Advised that an abnormal Thyroid level is not urgent or life threatening. Instructed caller that the most common symptoms of any antibiotic is nausea, vomiting, or stomach upset. She should take it with food to prevent those symptoms. She can take a probiotic with the antibiotic if she wants. Also advised using another form of birth control while on it. Disp. Time Eilene Ghazi Time) Disposition Final User 02/10/2021 9:00:38 AM Clinical Call Yes [place], RN, Amy

## 2021-03-16 ENCOUNTER — Other Ambulatory Visit (HOSPITAL_BASED_OUTPATIENT_CLINIC_OR_DEPARTMENT_OTHER): Payer: Self-pay

## 2021-03-26 ENCOUNTER — Ambulatory Visit: Payer: Medicaid Other | Admitting: Family Medicine

## 2021-03-26 ENCOUNTER — Encounter: Payer: Self-pay | Admitting: Family Medicine

## 2021-03-26 VITALS — BP 108/62 | HR 86 | Temp 98.0°F | Ht 65.0 in | Wt 189.5 lb

## 2021-03-26 DIAGNOSIS — R42 Dizziness and giddiness: Secondary | ICD-10-CM | POA: Diagnosis not present

## 2021-03-26 DIAGNOSIS — R2689 Other abnormalities of gait and mobility: Secondary | ICD-10-CM

## 2021-03-26 NOTE — Patient Instructions (Signed)
If you do not hear anything about your referral in the next 1-2 weeks, call our office and ask for an update.  Stay hydrated.  OK to continue meclizine, let me know if you need a refill.   Let us know if you need anything.

## 2021-03-26 NOTE — Progress Notes (Signed)
Chief Complaint  Patient presents with   Dizziness    Subjective: Patient is a 31 y.o. female here for f/u.  Patient continues to have intermittent lightheadedness.  The last for 5 to 10 minutes up to the entire day.  She only felt like she was going to pass out on 1 occasion.  She stopped drinking caffeine since her last visit and no longer has palpitations.  She does not feel like she is spinning.  She is eating and drinking normally.  She denies any recent fevers or infections.  Nothing seems to trigger her symptoms including moving her head or rising.  This is not exertional.  Her laboratory work-up 1 month ago was negative.  She had a visit virtually with an urgent care.  They recommended she follow-up here in addition to trying meclizine.  It did help.  Past Medical History:  Diagnosis Date   Abnormal Pap smear of cervix 2011   Dysmenorrhea    Leiomyoma of uterus    Menometrorrhagia    Seasonal allergies     Objective: BP 108/62    Pulse 86    Temp 98 F (36.7 C) (Oral)    Ht 5\' 5"  (1.651 m)    Wt 189 lb 8 oz (86 kg)    LMP 10/16/2019    SpO2 98%    BMI 31.53 kg/m  General: Awake, appears stated age 61: Nares are patent without discharge, ears are patent without abnormal TMs, MMM Neuro: Dix-Hallpike negative bilaterally, DTRs equal and symmetric throughout, no clonus, no cerebellar signs, 5/5 strength throughout Heart: RRR, no LE edema, no bruits Lungs: CTAB, no rales, wheezes or rhonchi. No accessory muscle use Psych: Age appropriate judgment and insight, normal affect and mood  Assessment and Plan: Balance problem - Plan: Ambulatory referral to Neurology  Lightheaded - Plan: Ambulatory referral to Neurology  Chronic, not controlled.  Continue meclizine 25 mg every 8 hours as needed.  Refer to neurology.  Given her age and lack of syncopal/presyncopal episodes, I do not think cardiology referral is the right decision.  She is not having true vertigo so I do not think  vestibular rehab would be beneficial.  She is not complaining of any ENT symptoms and her exam is unremarkable so ENT is likely not going to be a fruitful visit.  She will let me know if anything changes.  We will hold off on imaging given nonemergent nature of this. The patient voiced understanding and agreement to the plan.  [place], DO 03/26/21  8:30 AM

## 2021-03-27 ENCOUNTER — Encounter: Payer: Self-pay | Admitting: Neurology

## 2021-04-16 ENCOUNTER — Other Ambulatory Visit (HOSPITAL_COMMUNITY): Payer: Self-pay

## 2021-05-07 ENCOUNTER — Ambulatory Visit: Payer: Medicaid Other | Admitting: Neurology

## 2021-05-07 ENCOUNTER — Encounter: Payer: Self-pay | Admitting: Neurology

## 2021-05-11 ENCOUNTER — Encounter: Payer: Self-pay | Admitting: Neurology

## 2021-05-11 ENCOUNTER — Ambulatory Visit (INDEPENDENT_AMBULATORY_CARE_PROVIDER_SITE_OTHER): Payer: Medicaid Other | Admitting: Neurology

## 2021-05-11 ENCOUNTER — Other Ambulatory Visit: Payer: Self-pay

## 2021-05-11 VITALS — BP 135/83 | HR 87 | Ht 65.0 in | Wt 195.0 lb

## 2021-05-11 DIAGNOSIS — R42 Dizziness and giddiness: Secondary | ICD-10-CM

## 2021-05-11 NOTE — Patient Instructions (Signed)
Return to clinic as needed
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
## 2021-05-23 ENCOUNTER — Telehealth: Payer: Medicaid Other | Admitting: Physician Assistant

## 2021-05-23 ENCOUNTER — Other Ambulatory Visit (HOSPITAL_BASED_OUTPATIENT_CLINIC_OR_DEPARTMENT_OTHER): Payer: Self-pay

## 2021-05-23 DIAGNOSIS — R1013 Epigastric pain: Secondary | ICD-10-CM | POA: Diagnosis not present

## 2021-05-23 MED ORDER — ONDANSETRON HCL 4 MG PO TABS: 4.0000 mg | ORAL_TABLET | Freq: Three times a day (TID) | ORAL | 0 refills | Status: DC | PRN

## 2021-05-23 MED ORDER — FAMOTIDINE 20 MG PO TABS
20.0000 mg | ORAL_TABLET | Freq: Two times a day (BID) | ORAL | 0 refills | Status: DC
  Filled 2021-05-23 – 2021-05-24 (×2): qty 28, 14d supply, fill #0

## 2021-05-23 MED ORDER — ONDANSETRON HCL 4 MG PO TABS
4.0000 mg | ORAL_TABLET | Freq: Three times a day (TID) | ORAL | 0 refills | Status: DC | PRN
  Filled 2021-05-23: qty 20, 7d supply, fill #0

## 2021-05-23 NOTE — Patient Instructions (Signed)
?  Penny Klein, thank you for joining Rodney Booze, PA-C for today's virtual visit.  While this provider is not your primary care provider (PCP), if your PCP is located in our provider database this encounter information will be shared with them immediately following your visit. ? ?Consent: ?(Patient) Penny Klein provided verbal consent for this virtual visit at the beginning of the encounter. ? ?Current Medications: ? ?Current Outpatient Medications:  ?  Bacillus Coagulans-Inulin (ALIGN PREBIOTIC-PROBIOTIC PO), Take by mouth., Disp: , Rfl:  ?  fluconazole (DIFLUCAN) 150 MG tablet, Take 1 tablet by mouth as directed, repeat in 72 hours if no improvement. (Patient not taking: Reported on 05/11/2021), Disp: 2 tablet, Rfl: 0 ?  fluticasone (FLONASE) 50 MCG/ACT nasal spray, Place 2 sprays into both nostrils daily., Disp: 16 g, Rfl: 6 ?  metroNIDAZOLE (FLAGYL) 500 MG tablet, Take 1 tablet (500 mg total) by mouth 2 (two) times daily. (Patient not taking: Reported on 05/11/2021), Disp: 14 tablet, Rfl: 0 ?  omeprazole (PRILOSEC) 40 MG capsule, Take 40 mg by mouth daily., Disp: , Rfl:  ?  rizatriptan (MAXALT) 5 MG tablet, TAKE 1 TABLET BY MOUTH AT ONSET OF HEADACHE, MAY REPEAT AFTER 2 HOURS AS NEEDED FOR ACUTE HEADACHES, Disp: 15 tablet, Rfl: 1  ? ?Medications ordered in this encounter:  ?No orders of the defined types were placed in this encounter. ?  ? ?*If you need refills on other medications prior to your next appointment, please contact your pharmacy* ? ?Follow-Up: ?Call back or seek an in-person evaluation if the symptoms worsen or if the condition fails to improve as anticipated. ? ?Other Instructions ?Continue your omeprazole and start taking pepcid as directed. You can also use the nausea medication as needed . ? ?If you are not improving or are worsening over the next 24-48 hours you will need to be seen in person for an exam and likely lab work.  ? ? ?If you have been instructed to have an in-person  evaluation today at a local Urgent Care facility, please use the link below. It will take you to a list of all of our available [facility], including address, phone number and hours of operation. Please do not delay care.  ?[facility] ? ?If you or a family member do not have a primary care provider, use the link below to schedule a visit and establish care. When you choose a [facility] primary care physician or advanced practice provider, you gain a long-term partner in health. ?Find a Primary Care Provider ? ?Learn more about [facility] in-office and virtual care options: ?[facility] ? ?

## 2021-05-23 NOTE — Progress Notes (Signed)
Ms. Penny Klein, Penny Klein are scheduled for a virtual visit with your provider today.   ? ?Just as we do with appointments in the office, we must obtain your consent to participate.  Your consent will be active for this visit and any virtual visit you may have with one of our providers in the next 365 days.   ? ?If you have a MyChart account, I can also send a copy of this consent to you electronically.  All virtual visits are billed to your insurance company just like a traditional visit in the office.  As this is a virtual visit, video technology does not allow for your provider to perform a traditional examination.  This may limit your provider's ability to fully assess your condition.  If your provider identifies any concerns that need to be evaluated in person or the need to arrange testing such as labs, EKG, etc, we will make arrangements to do so.   ? ?Although advances in technology are sophisticated, we cannot ensure that it will always work on either your end or our end.  If the connection with a video visit is poor, we may have to switch to a telephone visit.  With either a video or telephone visit, we are not always able to ensure that we have a secure connection.   I need to obtain your verbal consent now.   Are you willing to proceed with your visit today?  ? ?Penny Klein has provided verbal consent on 05/23/2021 for a virtual visit (video or telephone). ? ? ?Penny Yankowski Gilman Schmidt, PA-C ?05/23/2021  2:45 PM ? ? ?Date:  05/23/2021  ? ?[name], DOB 1990-03-19, MRN [id number] ? ?Patient Location: Home ?Provider Location: Home Office ? ? ?Participants: Patient and Provider for Visit and Wrap up ? ?Method of visit: Video  ?Location of Patient: Home ?Location of Provider: Home Office ?Consent was obtain for visit over the video. ?Services rendered by provider: Visit was performed via video ? ?A video enabled telemedicine application was used and I verified that I am speaking with the correct person using two  identifiers. ? ?PCP:  [name], DO  ? ?Chief Complaint:  abd pain ? ?History of Present Illness:   ? ?Penny Klein is a 31 y.o. female with history as stated below. ?Presents video telehealth for an acute Klein visit ? ?Pt is c/o epigastric abd pain for the last 3 days. Pain is intermittent and seems to be worse at night and throughout the day. Pain is worse when she is hungry. She does have a h/o acid reflux and this feels somewhat similar. Fried foods and acidic foods seem to make it worse. She states she was on a diet last week where she was eating low fats and more fruits and vegetables.  However, a few days ago she had [employer] chicken nuggets and fries. She was previously on omeprazole for acid reflux. She had stopped taking is last week and restarted it 2 days ago after sxs started again.  ? ?She reports nausea, but has not vomited. Denies fevers. Denies chance of pregnancy ? ?Past Medical, Surgical, Social History, Allergies, and Medications have been Reviewed. ? ?Past Medical History:  ?Diagnosis Date  ? Abnormal Pap smear of cervix 2011  ? Dysmenorrhea   ? Leiomyoma of uterus   ? Menometrorrhagia   ? Seasonal allergies   ? ? ?Current Meds  ?Medication Sig  ? famotidine (PEPCID) 20 MG tablet Take 1 tablet (20 mg total) by mouth  2 (two) times daily for 14 days.  ? [DISCONTINUED] ondansetron (ZOFRAN) 4 MG tablet Take 1 tablet (4 mg total) by mouth every 8 (eight) hours as needed for nausea or vomiting.  ?  ? ?Allergies:   Ciprofloxacin  ? ?ROS ?See HPI for history of present illness. ? ?Physical Exam ?Constitutional:   ?   Appearance: Normal appearance. She is not ill-appearing.  ?Neurological:  ?   Mental Status: She is alert.  ? ? ?         MDM: epigastric discomfort. Hx gerd. Suspect this is an exacerbation for her acid reflux given she stopped her medication last week and started a new diet. Advised continuation of omeprazole and will add h2 blocker for 2 weeks. Advised if no better or  worsening of sxs she needs to be seen in person to r/o gallbladder or other pathology.  ? ?There are no diagnoses linked to this encounter. ? ? ?Time:   ?Today, I have spent 15 minutes with the patient with telehealth technology discussing the above problems, reviewing the chart, previous notes, medications and orders.  ? ? ?Tests Ordered: ?No orders of the defined types were placed in this encounter. ? ? ?Medication Changes: ?Meds ordered this encounter  ?Medications  ? famotidine (PEPCID) 20 MG tablet  ?  Sig: Take 1 tablet (20 mg total) by mouth 2 (two) times daily for 14 days.  ?  Dispense:  28 tablet  ?  Refill:  0  ?  Order Specific Question:   Supervising Provider  ?  Answer:   Noemi Chapel [[id number]]  ? DISCONTD: ondansetron (ZOFRAN) 4 MG tablet  ?  Sig: Take 1 tablet (4 mg total) by mouth every 8 (eight) hours as needed for nausea or vomiting.  ?  Dispense:  20 tablet  ?  Refill:  0  ?  Order Specific Question:   Supervising Provider  ?  Answer:   Noemi Chapel [[id number]]  ? ondansetron (ZOFRAN) 4 MG tablet  ?  Sig: Take 1 tablet (4 mg total) by mouth every 8 (eight) hours as needed for nausea or vomiting.  ?  Dispense:  20 tablet  ?  Refill:  0  ?  Order Specific Question:   Supervising Provider  ?  Answer:   Noemi Chapel [[id number]]  ? ? ? ?Disposition:  Follow up  ?Signed, ?Gwendloyn Forsee Gilman Schmidt, PA-C  ?05/23/2021 2:45 PM    ? ? ?

## 2021-05-24 ENCOUNTER — Other Ambulatory Visit (HOSPITAL_BASED_OUTPATIENT_CLINIC_OR_DEPARTMENT_OTHER): Payer: Self-pay

## 2021-05-24 ENCOUNTER — Other Ambulatory Visit (HOSPITAL_COMMUNITY): Payer: Self-pay

## 2021-05-28 ENCOUNTER — Telehealth: Payer: Self-pay

## 2021-05-28 ENCOUNTER — Ambulatory Visit: Payer: Medicaid Other | Admitting: Family Medicine

## 2021-05-28 NOTE — Telephone Encounter (Signed)
Nurse Assessment ?Nurse: Scales, RN, Debroah Loop Date/Time (Eastern Time): 05/28/2021 6:58:45 AM ?Confirm and document reason for call. If ?symptomatic, describe symptoms. ?---Caller states that she was having stomach pain since ?last week. Was given famotidine to take is taking ?omeprazole. Stomach problem has cleared up and ?requesting to cancel. States that she has constipation ?problems and has been taking Miralax x 3 days but not ?helping ?Does the patient have any new or worsening ?symptoms? ---Yes ?Will a triage be completed? ---Yes ?Related visit to physician within the last 2 weeks? ---No ?Does the PT have any chronic conditions? (i.e. ?diabetes, asthma, this includes High risk factors for ?pregnancy, etc.) ?---No ?Is the patient pregnant or possibly pregnant? (Ask ?all females between the ages of 87-55) ---No ?Is this a behavioral health or substance abuse call? ---No ?Guidelines ?Guideline Title Affirmed Question Affirmed Notes Nurse Date/Time (Eastern ?Time) ?Constipation Last bowel ?movement (BM) > 4 ?days ago ?Scales, RN, Debroah Loop 05/28/2021 7:01:33 ?AM ?Disp. Time (Eastern ?Time) Disposition Final User ?PLEASE NOTE: All timestamps contained within this report are represented as [place] Standard Time. ?CONFIDENTIALTY NOTICE: This fax transmission is intended only for the addressee. It contains information that is legally privileged, confidential or ?otherwise protected from use or disclosure. If you are not the intended recipient, you are strictly prohibited from reviewing, disclosing, copying using ?or disseminating any of this information or taking any action in reliance on or regarding this information. If you have received this fax in error, please ?notify us immediately by telephone so that we can arrange for its return to [name]. Phone: [phone], Toll-Free: [phone], Fax: [phone] ?Page: 2 of 2 ?Call Id: [id number] ?05/28/2021 7:04:16 AM See PCP within 24 Hours Yes Scales, RN, Debroah Loop ?Caller  Disagree/Comply Comply ?Caller Understands Yes ?PreDisposition Home Care ?Care Advice Given Per Guideline ?SEE PCP WITHIN 24 HOURS: CALL BACK IF: * Severe or increasing abdomen pain * Constant abdomen pain lasting over 2 ?hours * Vomiting occurs * You become worse CARE ADVICE given per Constipation (Adult) guideline. ?Referrals ?REFERRED TO PCP OFFICE ?

## 2021-05-28 NOTE — Telephone Encounter (Signed)
Called rescheduled for this coming Friday at 7:45 AM. ?Patient was unable to come today 05/28/21 due to daughters bus being late ? ?

## 2021-05-28 NOTE — Telephone Encounter (Signed)
Caller Name Tashonna Descoteaux ?Caller Phone Number [phone] ?Patient Name Penny Klein ?Patient DOB 10/12/1990 ?Call Type Message Only Information Provided ?Reason for Call Request to Reschedule Office Appointment ?Initial Comment Caller states they need to reschedule appt. ?Patient request to speak to RN No ?Additional Comment Provided office hours ?Disp. Time Disposition Final User ?05/28/2021 7:15:59 AM General Information Provided Yes Ian Malkin ?Call Closed By: Ian Malkin ?Transaction Date/Time: 05/28/2021 7:13:30 AM (ET) ?

## 2021-06-01 ENCOUNTER — Ambulatory Visit (INDEPENDENT_AMBULATORY_CARE_PROVIDER_SITE_OTHER): Payer: Medicaid Other | Admitting: Family Medicine

## 2021-06-01 ENCOUNTER — Encounter: Payer: Self-pay | Admitting: Family Medicine

## 2021-06-01 ENCOUNTER — Other Ambulatory Visit (HOSPITAL_COMMUNITY): Payer: Self-pay

## 2021-06-01 VITALS — BP 126/80 | HR 73 | Temp 97.8°F | Resp 16 | Ht 65.0 in | Wt 191.1 lb

## 2021-06-01 DIAGNOSIS — K219 Gastro-esophageal reflux disease without esophagitis: Secondary | ICD-10-CM | POA: Diagnosis not present

## 2021-06-01 MED ORDER — OMEPRAZOLE 40 MG PO CPDR
40.0000 mg | DELAYED_RELEASE_CAPSULE | Freq: Every day | ORAL | 2 refills | Status: DC
  Filled 2021-06-01: qty 90, 90d supply, fill #0

## 2021-06-01 NOTE — Patient Instructions (Addendum)
Keep the diet clean and stay active. ? ?Continue to use Pepcid as needed.  ? ?Aim to do some physical exertion for 150 minutes per week. This is typically divided into 5 days per week, 30 minutes per day. The activity should be enough to get your heart rate up. Anything is better than nothing if you have time constraints. ? ?Increase your protein intake. Try to get in around 30-35 g per meal.  ? ?The only lifestyle changes that have data behind them are weight loss for the overweight/obese and elevating the head of the bed. Finding out which foods/positions are triggers is important. ? ?Let us know if you need anything. ? ?Healthy Eating Plan ?Many factors influence your heart health, including eating and exercise habits. Heart (coronary) risk increases with abnormal blood fat (lipid) levels. Heart-healthy meal planning includes limiting unhealthy fats, increasing healthy fats, and making other small dietary changes. This includes maintaining a healthy body weight to help keep lipid levels within a normal range. ? ?WHAT IS MY PLAN?  ?Your health care provider recommends that you: ?Drink a glass of water before meals to help with satiety. ?Eat slowly. ?An alternative to the water is to add Metamucil. This will help with satiety as well. It does contain calories, unlike water. ? ?WHAT TYPES OF FAT SHOULD I CHOOSE? ?Choose healthy fats more often. Choose monounsaturated and polyunsaturated fats, such as olive oil and canola oil, flaxseeds, walnuts, almonds, and seeds. ?Eat more omega-3 fats. Good choices include salmon, mackerel, sardines, tuna, flaxseed oil, and ground flaxseeds. Aim to eat fish at least two times each week. ?Avoid foods with partially hydrogenated oils in them. These contain trans fats. Examples of foods that contain trans fats are stick margarine, some tub margarines, cookies, crackers, and other baked goods. If you are going to avoid a fat, this is the one to avoid! ? ?WHAT GENERAL GUIDELINES DO I  NEED TO FOLLOW? ?Check food labels carefully to identify foods with trans fats. Avoid these types of options when possible. ?Fill one half of your plate with vegetables and green salads. Eat 4-5 servings of vegetables per day. A serving of vegetables equals 1 cup of raw leafy vegetables, ? cup of raw or cooked cut-up vegetables, or ? cup of vegetable juice. ?Fill one fourth of your plate with whole grains. Look for the word "whole" as the first word in the ingredient list. ?Fill one fourth of your plate with lean protein foods. ?Eat 4-5 servings of fruit per day. A serving of fruit equals one medium whole fruit, ? cup of dried fruit, ? cup of fresh, frozen, or canned fruit. Try to avoid fruits in cups/syrups as the sugar content can be high. ?Eat more foods that contain soluble fiber. Examples of foods that contain this type of fiber are apples, broccoli, carrots, beans, peas, and barley. Aim to get 20-30 g of fiber per day. ?Eat more home-cooked food and less restaurant, buffet, and fast food. ?Limit or avoid alcohol. ?Limit foods that are high in starch and sugar. ?Avoid fried foods when able. ?Cook foods by using methods other than frying. Baking, boiling, grilling, and broiling are all great options. Other fat-reducing suggestions include: ?Removing the skin from poultry. ?Removing all visible fats from meats. ?Skimming the fat off of stews, soups, and gravies before serving them. ?Steaming vegetables in water or broth. ?Lose weight if you are overweight. Losing just 5-10% of your initial body weight can help your overall health and prevent diseases such  as diabetes and heart disease. ?Increase your consumption of nuts, legumes, and seeds to 4-5 servings per week. One serving of dried beans or legumes equals ? cup after being cooked, one serving of nuts equals 1? ounces, and one serving of seeds equals ? ounce or 1 tablespoon. ? ?WHAT ARE GOOD FOODS CAN I EAT? ?Grains ?Grainy breads (try to find bread that is 3  g of fiber per slice or greater), oatmeal, light popcorn. Whole-grain cereals. Rice and pasta, including brown rice and those that are made with whole wheat. Edamame pasta is a great alternative to grain pasta. It has a higher protein content. Try to avoid significant consumption of white bread, sugary cereals, or pastries/baked goods. ? ?Vegetables ?All vegetables. Cooked white potatoes do not count as vegetables. ? ?Fruits ?All fruits, but limit pineapple and bananas as these fruits have a higher sugar content. ? ?Meats and Other Protein Sources ?Lean, well-trimmed beef, veal, pork, and lamb. Chicken and [place] without skin. All fish and shellfish. Wild duck, rabbit, pheasant, and venison. Egg whites or low-cholesterol egg substitutes. Dried beans, peas, lentils, and tofu. Seeds and most nuts. ? ?Dairy ?Low-fat or nonfat cheeses, including ricotta, string, and mozzarella. Skim or 1% milk that is liquid, powdered, or evaporated. Buttermilk that is made with low-fat milk. Nonfat or low-fat yogurt. Soy/Almond milk are good alternatives if you cannot handle dairy. ? ?Beverages ?Water is the best for you. Sports drinks with less sugar are more desirable unless you are a highly active athlete. ? ?Sweets and Desserts ?Sherbets and fruit ices. Honey, jam, marmalade, jelly, and syrups. Dark chocolate.  ?Eat all sweets and desserts in moderation. ? ?Fats and Oils ?Nonhydrogenated (trans-free) margarines. Vegetable oils, including soybean, sesame, sunflower, olive, peanut, safflower, corn, canola, and cottonseed. Salad dressings or mayonnaise that are made with a vegetable oil. Limit added fats and oils that you use for cooking, baking, salads, and as spreads. ? ?Other ?Cocoa powder. Coffee and tea. Most condiments. ? ?The items listed above may not be a complete list of recommended foods or beverages. Contact your dietitian for more options. ?

## 2021-06-01 NOTE — Progress Notes (Signed)
Chief Complaint  ?Patient presents with  ? Follow-up  ? ? ?GERD ?Penny Klein is a 31 y.o. female who presents for follow up of GERD. ?Takes Prilosec 40 mg/d.  ?She denies heartburn, bilious reflux, waterbrash, cough, weight loss, bowel changes ?Aggravating factors and specific triggers include citrus juices and acidicity . ?Alleviating factors include antacids and NSAIDs ?Risk factors present for GERD include obesity and famhx .  ? ?Past Medical History:  ?Diagnosis Date  ? Abnormal Pap smear of cervix 2011  ? Dysmenorrhea   ? Leiomyoma of uterus   ? Menometrorrhagia   ? Seasonal allergies   ? ? ?Exam ?BP 126/80 (BP Location: Left Arm, Patient Position: Sitting, Cuff Size: Small)   Pulse 73   Temp 97.8 ?F (36.6 ?C) (Oral)   Resp 16   Ht '5\' 5"'$  (1.651 m)   Wt 191 lb 2 oz (86.7 kg)   LMP 10/16/2019   SpO2 99%   BMI 31.80 kg/m?  ?General:  well developed, well nourished, in no apparent distress ?Lungs:  clear to auscultation, breath sounds equal bilaterally, no respiratory distress, no wheezes ?Cardio:  regular rate and rhythm without murmurs, heart sounds without clicks or rubs ?Abdomen:  abdomen soft, nontender; bowel sounds normal; no masses or organomegaly ?Psych: Normal affect and mood ? ?Assessment and Plan ? ?Gastroesophageal reflux disease, unspecified whether esophagitis present - Plan: omeprazole (PRILOSEC) 40 MG capsule ? ?Cont Prilosec 40 mg/d. Pepcid prn. Counseled on GERD diet and precautions ?Follow up for CPE as scheduled.  ?Pt voiced understanding and agreement to the plan. ? ?[name], DO ?06/01/21  ?8:14 AM ? ?

## 2021-06-07 ENCOUNTER — Other Ambulatory Visit (HOSPITAL_COMMUNITY): Payer: Self-pay

## 2021-06-07 MED ORDER — CARESTART COVID-19 HOME TEST VI KIT
PACK | 0 refills | Status: DC
Start: 2021-06-07 — End: 2021-07-18
  Filled 2021-06-07: qty 2, 4d supply, fill #0

## 2021-06-08 ENCOUNTER — Other Ambulatory Visit (HOSPITAL_COMMUNITY): Payer: Self-pay

## 2021-06-13 ENCOUNTER — Telehealth: Payer: Medicaid Other | Admitting: Nurse Practitioner

## 2021-06-13 ENCOUNTER — Encounter: Payer: Self-pay | Admitting: Nurse Practitioner

## 2021-06-13 ENCOUNTER — Other Ambulatory Visit (HOSPITAL_COMMUNITY): Payer: Self-pay

## 2021-06-13 DIAGNOSIS — J0100 Acute maxillary sinusitis, unspecified: Secondary | ICD-10-CM | POA: Diagnosis not present

## 2021-06-13 MED ORDER — AMOXICILLIN-POT CLAVULANATE 875-125 MG PO TABS
1.0000 | ORAL_TABLET | Freq: Two times a day (BID) | ORAL | 0 refills | Status: DC
  Filled 2021-06-13: qty 14, 7d supply, fill #0

## 2021-06-13 NOTE — Progress Notes (Signed)
? ?Virtual Visit Consent  ? ?Penny Klein, you are scheduled for a virtual visit with Penny Hassell Done, FNP, a [facility] provider, today.   ?  ?Just as with appointments in the office, your consent must be obtained to participate.  Your consent will be active for this visit and any virtual visit you may have with one of our providers in the next 365 days.   ?  ?If you have a MyChart account, a copy of this consent can be sent to you electronically.  All virtual visits are billed to your insurance company just like a traditional visit in the office.   ? ?As this is a virtual visit, video technology does not allow for your provider to perform a traditional examination.  This may limit your provider's ability to fully assess your condition.  If your provider identifies any concerns that need to be evaluated in person or the need to arrange testing (such as labs, EKG, etc.), we will make arrangements to do so.   ?  ?Although advances in technology are sophisticated, we cannot ensure that it will always work on either your end or our end.  If the connection with a video visit is poor, the visit may have to be switched to a telephone visit.  With either a video or telephone visit, we are not always able to ensure that we have a secure connection.    ? ?I need to obtain your verbal consent now.   Are you willing to proceed with your visit today? YES ?  ?Penny Klein has provided verbal consent on 06/13/2021 for a virtual visit (video or telephone). ?  ?Penny Hassell Done, FNP  ? ?Date: 06/13/2021 10:00 AM ? ? ?Virtual Visit via Video Note  ? ?I, Penny Klein, connected with Penny Klein ([id number], [birth year]) on 06/13/21 at 10:00 AM EDT by a video-enabled telemedicine application and verified that I am speaking with the correct person using two identifiers. ? ?Location: ?Patient: Virtual Visit Location Patient: Home ?Provider: Virtual Visit Location Provider: Mobile ?  ?I discussed the limitations of  evaluation and management by telemedicine and the availability of in person appointments. The patient expressed understanding and agreed to proceed.   ? ?History of Present Illness: ?Penny Klein is a 31 y.o. who identifies as a female who was assigned female at birth, and is being seen today for sinusitis. ? ?HPI: Sinusitis ?This is a new problem. The current episode started 1 to 4 weeks ago (2 weeks). The problem has been gradually worsening since onset. There has been no fever. Her pain is at a severity of 7/10. The pain is moderate. Associated symptoms include congestion, coughing (sinus drainage has changed from clear to green.), a hoarse voice and sinus pressure. Pertinent negatives include no sneezing or sore throat. Past treatments include acetaminophen and spray decongestants. The treatment provided mild relief.   ?Review of Systems  ?HENT:  Positive for congestion, hoarse voice and sinus pressure. Negative for sneezing and sore throat.   ?Respiratory:  Positive for cough (sinus drainage has changed from clear to green.).   ? ?Problems:  ?Patient Active Problem List  ? Diagnosis Date Noted  ? Gastroesophageal reflux disease 06/01/2021  ? Nasal congestion with rhinorrhea 12/12/2020  ? Post-operative state 11/16/2019  ? Abnormal uterine bleeding (AUB) 11/16/2019  ? Abdominal pain, left upper quadrant 05/12/2019  ? Left flank pain 05/12/2019  ? De Quervain's disease (tenosynovitis) 08/11/2018  ? History of cervical LEEP biopsy affecting care of mother, antepartum 10/02/2017  ?  Vitamin D deficiency 03/17/2017  ? Abnormal weight gain 12/23/2016  ? Class 1 obesity due to excess calories without serious comorbidity with body mass index (BMI) of 33.0 to 33.9 in adult 12/23/2016  ?  ?Allergies:  ?Allergies  ?Allergen Reactions  ? Ciprofloxacin   ?  SHOB  ? ?Medications:  ?Current Outpatient Medications:  ?  Bacillus Coagulans-Inulin (ALIGN PREBIOTIC-PROBIOTIC PO), Take by mouth., Disp: , Rfl:  ?  COVID-19 At Home  Antigen Test (CARESTART COVID-19 HOME TEST) KIT, Use as directed., Disp: 2 each, Rfl: 0 ?  famotidine (PEPCID) 20 MG tablet, Take 1 tablet by mouth 2 times daily for 14 days., Disp: 28 tablet, Rfl: 0 ?  fluticasone (FLONASE) 50 MCG/ACT nasal spray, Place 2 sprays into both nostrils daily., Disp: 16 g, Rfl: 6 ?  omeprazole (PRILOSEC) 40 MG capsule, Take 1 capsule (40 mg total) by mouth daily., Disp: 90 capsule, Rfl: 2 ? ?Observations/Objective: ?Patient is well-developed, well-nourished in no acute distress.  ?Resting comfortably  at home.  ?Head is normocephalic, atraumatic.  ?No labored breathing.  ?Speech is clear and coherent with logical content.  ?Patient is alert and oriented at baseline.  ?Maxillary sinus presur eon palpation ? ?Assessment and Plan: ? ?Penny Klein in today with chief complaint of Sinusitis ? ? ?1. Acute non-recurrent maxillary sinusitis ?1. Take meds as prescribed ?2. Use a cool mist humidifier especially during the winter months and when heat has been humid. ?3. Use saline nose sprays frequently ?4. Saline irrigations of the nose can be very helpful if Klein frequently. ? * 4X daily for 1 week* ? * Use of a nettie pot can be helpful with this. Follow directions with this* ?5. Drink plenty of fluids ?6. Keep thermostat turn down low ?7.For any cough or congestion- mucinex d ?8. For fever or aces or pains- take tylenol or ibuprofen appropriate for age and weight. ? * for fevers greater than 101 orally you may alternate ibuprofen and tylenol every  3 hours. ?  ? ? ? ?Follow Up Instructions: ?I discussed the assessment and treatment plan with the patient. The patient was provided an opportunity to ask questions and all were answered. The patient agreed with the plan and demonstrated an understanding of the instructions.  A copy of instructions were sent to the patient via MyChart. ? ?The patient was advised to call back or seek an in-person evaluation if the symptoms worsen or if the condition  fails to improve as anticipated. ? ?Time:  ?I spent 9 minutes with the patient via telehealth technology discussing the above problems/concerns.   ? ?Penny Hassell Done, FNP ? ?

## 2021-06-13 NOTE — Patient Instructions (Signed)
1. Take meds as prescribed 2. Use a cool mist humidifier especially during the winter months and when heat has been humid. 3. Use saline nose sprays frequently 4. Saline irrigations of the nose can be very helpful if done frequently.  * 4X daily for 1 week*  * Use of a nettie pot can be helpful with this. Follow directions with this* 5. Drink plenty of fluids 6. Keep thermostat turn down low 7.For any cough or congestion- mucinex d 8. For fever or aces or pains- take tylenol or ibuprofen appropriate for age and weight.  * for fevers greater than 101 orally you may alternate ibuprofen and tylenol every  3 hours.    

## 2021-06-15 ENCOUNTER — Encounter: Payer: Self-pay | Admitting: Family Medicine

## 2021-06-28 IMAGING — US US TRANSVAGINAL NON-OB
1 series · 13 of 25 positions shown · non-contrast
Comparison: For 119 OB ultrasound

CLINICAL DATA: Pain. Pain with intrauterine device. Intrauterine
device removed. LMP 04/26/2019.

EXAM:
TRANSVAGINAL ULTRASOUND OF PELVIS
DOPPLER ULTRASOUND OF OVARIES
TECHNIQUE: Transvaginal ultrasound examination of the pelvis was performed
including evaluation of the uterus, ovaries, adnexal regions, and
pelvic cul-de-sac.
Color and duplex Doppler ultrasound was utilized to evaluate blood
flow to the ovaries.

[Series 1: us transvaginal non-ob · 27 acquisitions, 13 frames shown]
[im 1/27]
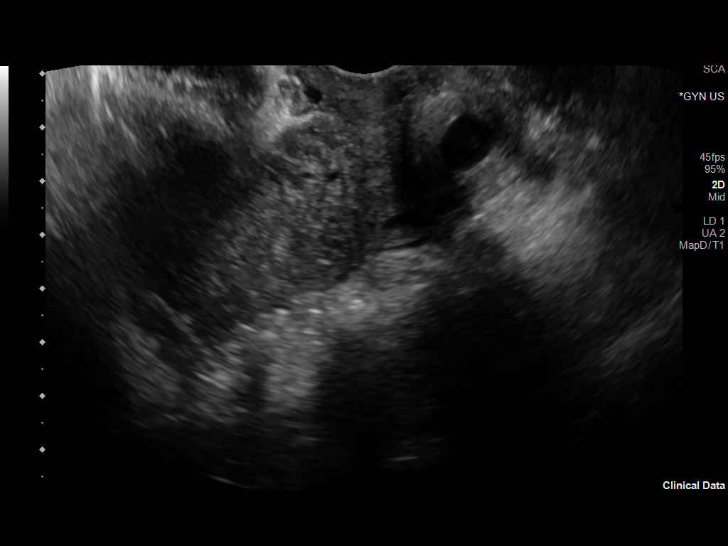
[im 3/27]
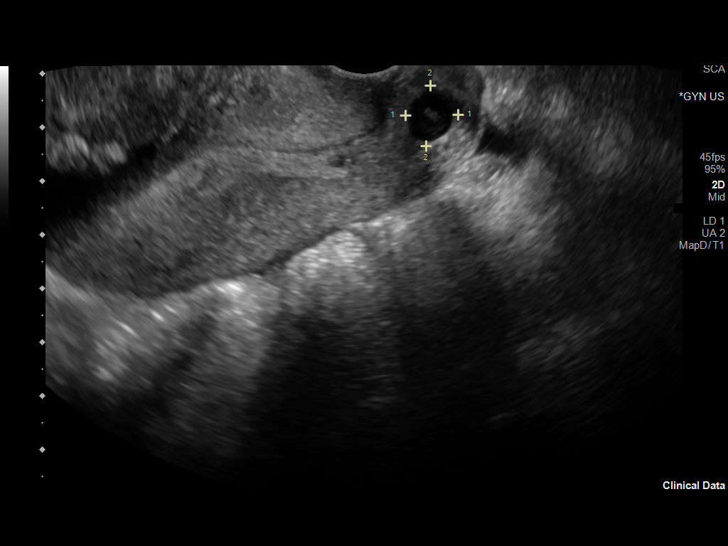
[im 5/27]
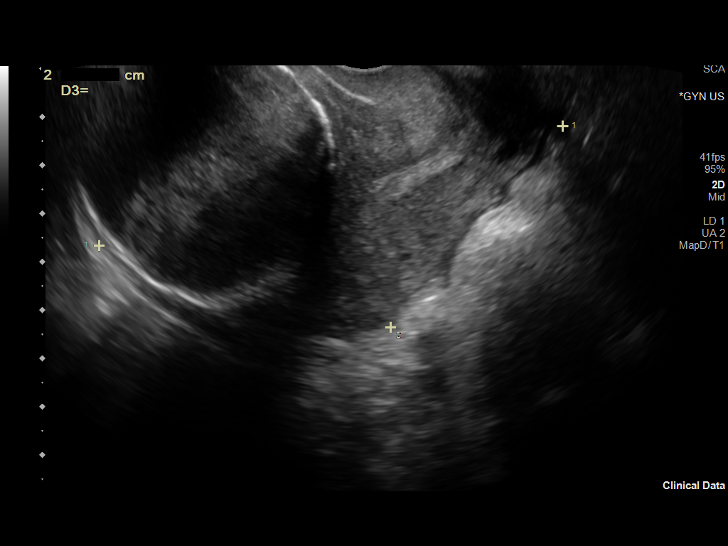
[im 7/27]
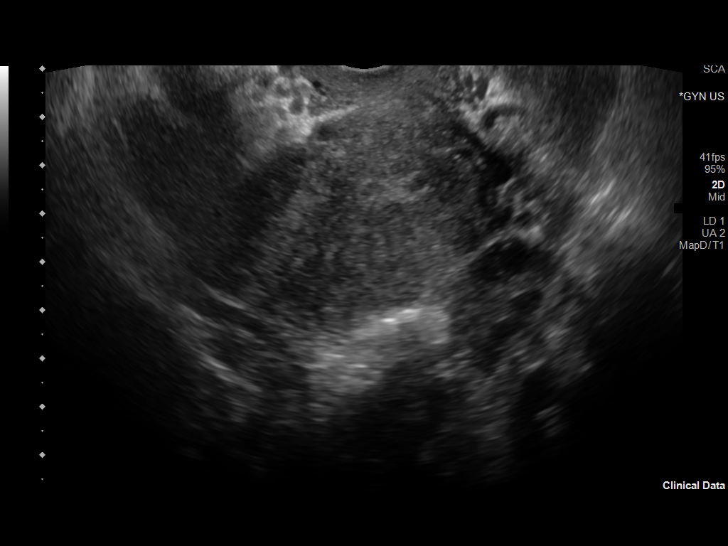
[im 9/27]
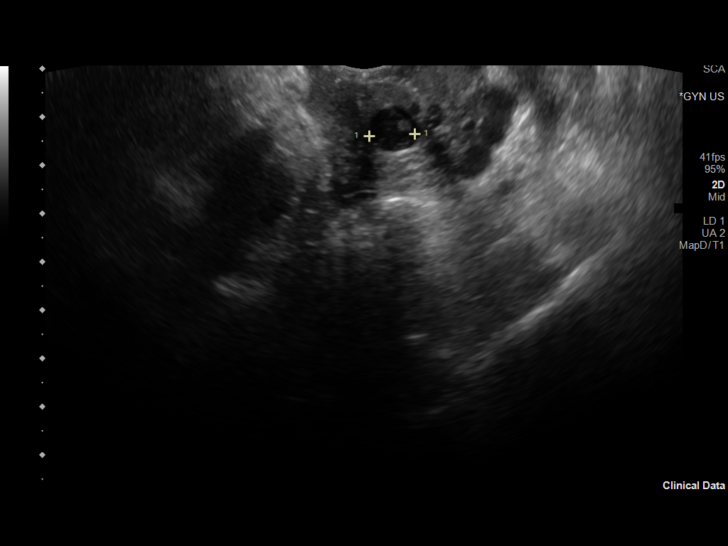
[im 11/27]
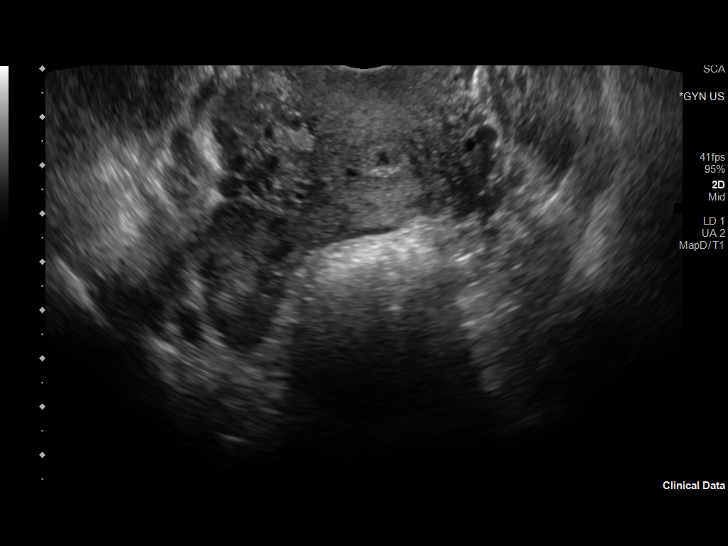
[im 14/27]
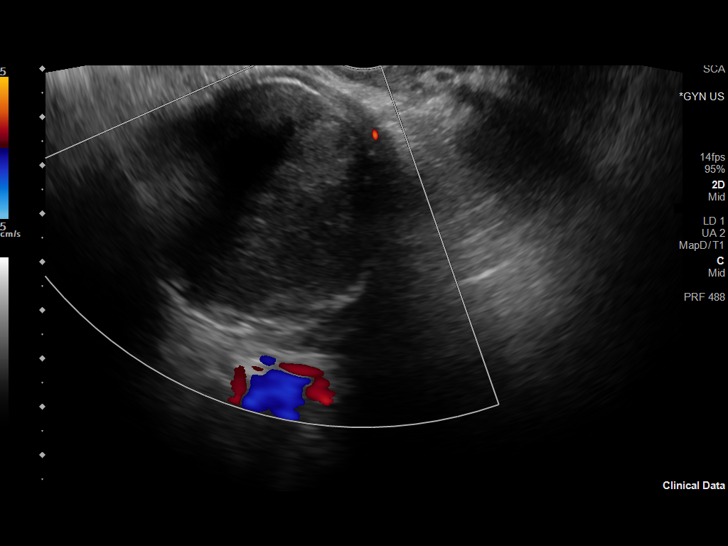
[im 16/27]
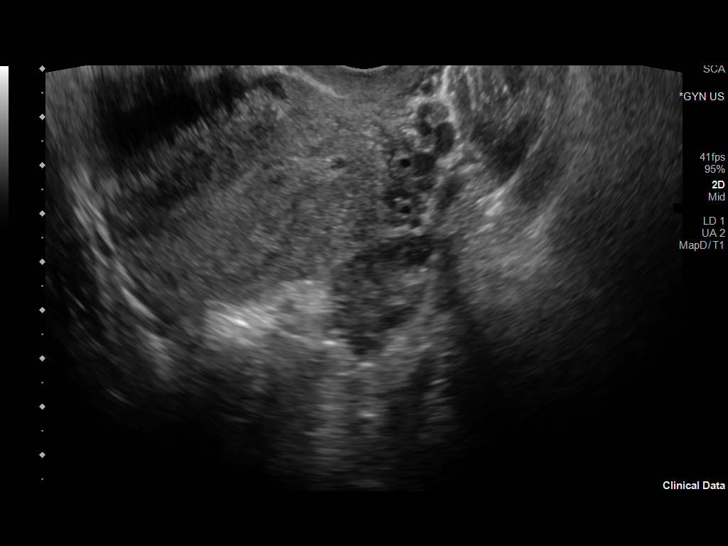
[im 18/27]
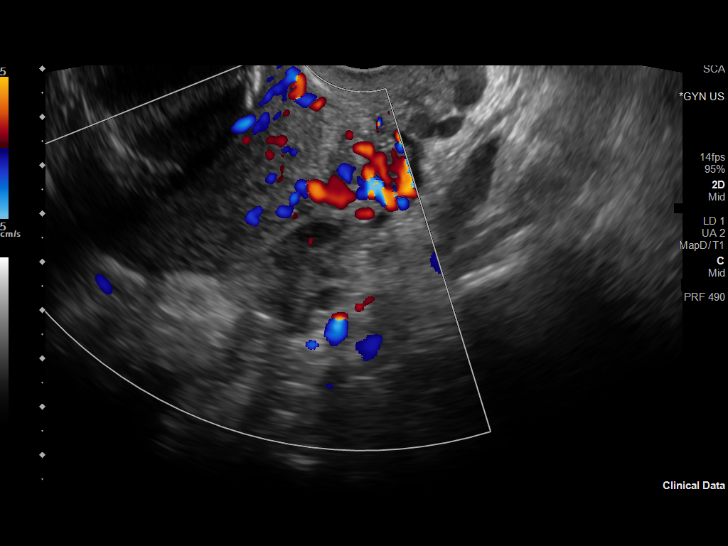
[im 20/27]
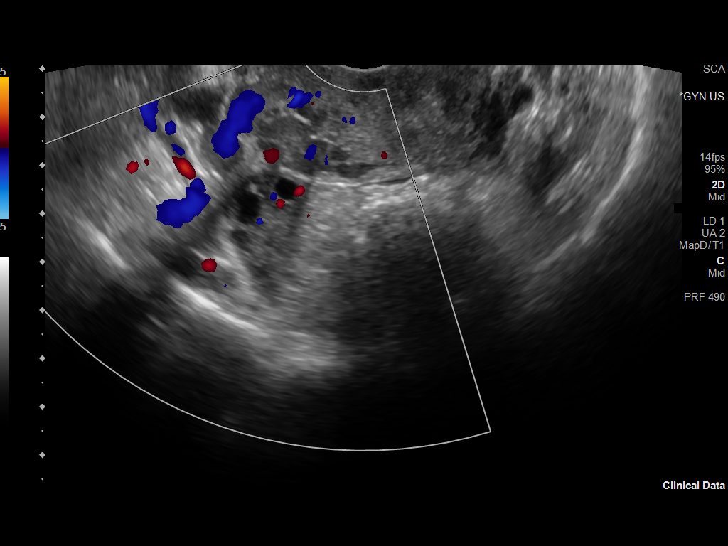
[im 22/27]
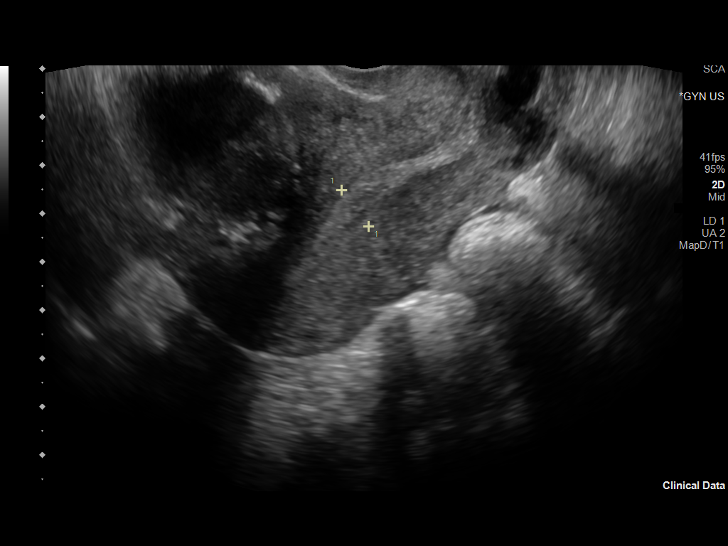
[im 24/27]
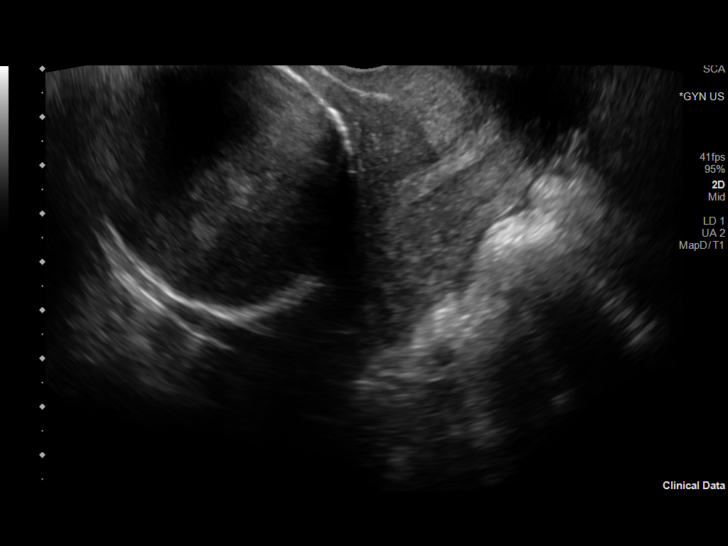
[im 27/27]
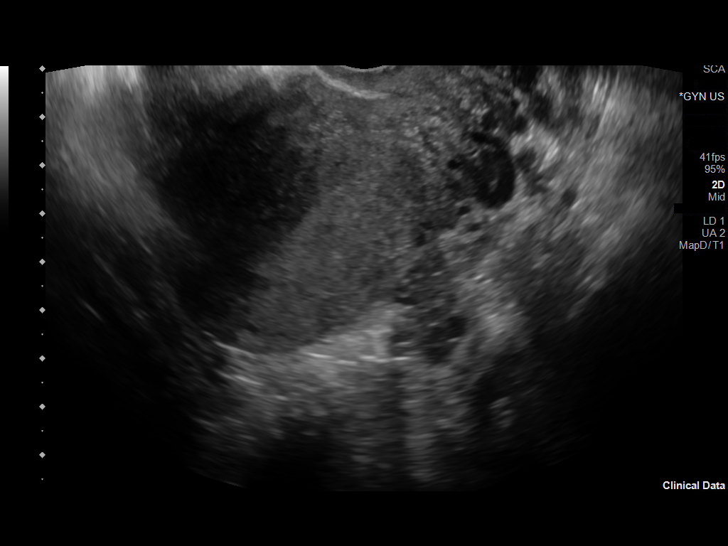

[13 of 25 positions shown; findings below may reference images not displayed]

FINDINGS: Uterus

Measurements: 9.9 x 6.8 x 6.3 centimeter = volume: 224.3 mL. Solid
partially calcified circumscribed mass is identified along the
anterior fundal region, measuring 5.4 x 5.5 x 5.4 centimeters and
consistent with fibroid.

Endometrium

Thickness: 9.4 millimeter.  Normal in appearance.

Right ovary

Measurements: 2.8 x 1.7 x 1.7 centimeter = volume: 4.4 mL. Normal
appearance/no adnexal mass.

Left ovary

Measurements: 2.9 x 1.8 x 1.7 centimeter = volume: 4.8 mL. Normal
appearance/no adnexal mass.

Pulsed Doppler evaluation demonstrates normal low-resistance
arterial and venous waveforms in both ovaries.
IMPRESSION: 1. 5.5 centimeter calcified fundal fibroid.
2. Endometrium normal thickness.
3. Normal appearance of both ovaries.
4. No free pelvic fluid.

## 2021-07-06 ENCOUNTER — Encounter: Payer: Self-pay | Admitting: Family Medicine

## 2021-07-06 ENCOUNTER — Telehealth: Payer: Self-pay | Admitting: Family Medicine

## 2021-07-06 ENCOUNTER — Ambulatory Visit (INDEPENDENT_AMBULATORY_CARE_PROVIDER_SITE_OTHER): Payer: No Typology Code available for payment source | Admitting: Family Medicine

## 2021-07-06 ENCOUNTER — Other Ambulatory Visit (HOSPITAL_COMMUNITY): Payer: Self-pay

## 2021-07-06 VITALS — BP 120/76 | HR 79 | Temp 98.4°F | Ht 65.0 in | Wt 199.0 lb

## 2021-07-06 DIAGNOSIS — E669 Obesity, unspecified: Secondary | ICD-10-CM

## 2021-07-06 DIAGNOSIS — Z0000 Encounter for general adult medical examination without abnormal findings: Secondary | ICD-10-CM

## 2021-07-06 DIAGNOSIS — Z6833 Body mass index (BMI) 33.0-33.9, adult: Secondary | ICD-10-CM | POA: Diagnosis not present

## 2021-07-06 LAB — CBC
HCT: 41.5 % (ref 36.0–46.0)
Hemoglobin: 13.8 g/dL (ref 12.0–15.0)
MCHC: 33.2 g/dL (ref 30.0–36.0)
MCV: 89.8 fl (ref 78.0–100.0)
Platelets: 262 10*3/uL (ref 150.0–400.0)
RBC: 4.62 Mil/uL (ref 3.87–5.11)
RDW: 13.5 % (ref 11.5–15.5)
WBC: 6.2 10*3/uL (ref 4.0–10.5)

## 2021-07-06 LAB — COMPREHENSIVE METABOLIC PANEL
ALT: 13 U/L (ref 0–35)
AST: 18 U/L (ref 0–37)
Albumin: 5 g/dL (ref 3.5–5.2)
Alkaline Phosphatase: 58 U/L (ref 39–117)
BUN: 10 mg/dL (ref 6–23)
CO2: 27 mEq/L (ref 19–32)
Calcium: 9.4 mg/dL (ref 8.4–10.5)
Chloride: 102 mEq/L (ref 96–112)
Creatinine, Ser: 0.6 mg/dL (ref 0.40–1.20)
GFR: 119.25 mL/min (ref >60.00)
Glucose, Bld: 77 mg/dL (ref 70–99)
Potassium: 4.2 mEq/L (ref 3.5–5.1)
Sodium: 136 mEq/L (ref 135–145)
Total Bilirubin: 0.8 mg/dL (ref 0.2–1.2)
Total Protein: 8.1 g/dL (ref 6.0–8.3)

## 2021-07-06 LAB — LIPID PANEL
Cholesterol: 207 mg/dL — ABNORMAL HIGH (ref 0–200)
HDL: 96.6 mg/dL (ref >39.00)
LDL Cholesterol: 103 mg/dL — ABNORMAL HIGH (ref 0–99)
NonHDL: 110.85
Total CHOL/HDL Ratio: 2
Triglycerides: 41 mg/dL (ref 0.0–149.0)
VLDL: 8.2 mg/dL (ref 0.0–40.0)

## 2021-07-06 MED ORDER — SEMAGLUTIDE-WEIGHT MANAGEMENT 0.25 MG/0.5ML ~~LOC~~ SOAJ
0.2500 mg | SUBCUTANEOUS | 0 refills | Status: DC
  Filled 2021-07-06: qty 2, 28d supply, fill #0

## 2021-07-06 MED ORDER — SEMAGLUTIDE-WEIGHT MANAGEMENT 2.4 MG/0.75ML ~~LOC~~ SOAJ
2.4000 mg | SUBCUTANEOUS | 0 refills | Status: DC
  Filled 2021-07-06 – 2021-09-19 (×2): qty 3, 28d supply, fill #0

## 2021-07-06 MED ORDER — SEMAGLUTIDE-WEIGHT MANAGEMENT 1.7 MG/0.75ML ~~LOC~~ SOAJ
1.7000 mg | SUBCUTANEOUS | 0 refills | Status: DC
Start: 2021-10-01 — End: 2021-10-02
  Filled 2021-07-06 – 2021-09-03 (×2): qty 3, 28d supply, fill #0

## 2021-07-06 MED ORDER — SEMAGLUTIDE-WEIGHT MANAGEMENT 1 MG/0.5ML ~~LOC~~ SOAJ
1.0000 mg | SUBCUTANEOUS | 0 refills | Status: AC
  Filled 2021-07-06 – 2021-08-14 (×2): qty 2, 28d supply, fill #0

## 2021-07-06 MED ORDER — SEMAGLUTIDE-WEIGHT MANAGEMENT 0.5 MG/0.5ML ~~LOC~~ SOAJ
0.5000 mg | SUBCUTANEOUS | 0 refills | Status: AC
  Filled 2021-07-06 – 2021-07-26 (×2): qty 2, 28d supply, fill #0

## 2021-07-06 NOTE — Patient Instructions (Addendum)
Give [name] 2-3 business days to get the results of your labs back.  ? ?Keep the diet clean and stay active. Add some weight resistance exercise.  ? ?Please get me a copy of your advanced directive form at your convenience.  ? ?Let me know if there are cost issues with the [facility].  ? ?Let us know if you need anything. ?

## 2021-07-06 NOTE — Progress Notes (Signed)
Chief Complaint  ?Patient presents with  ? Annual Exam  ?  ? ?Well Woman ?Penny Klein is here for a complete physical.   ?Her last physical was >1 year ago.  ?Current diet: in general, a "healthy" diet. ?Current exercise: active at work, walking. ?Contraception? Yes ?Fatigue out of ordinary? No ?Seatbelt? Yes ?Advanced directive? No ? ?Health Maintenance ?Pap/HPV- Yes ?Tetanus- Yes ?HIV screening- Yes ?Hep C screening- Yes ? ?Obesity ?Patient is having issues losing weight.  Her diet is healthy overall.  She is active at work and walks her dog when she gets home sometimes.  She does not do much in the way of weight lifting.  She has never been on any medication and has never seen a nutritionist/dietitian.  She has never seen a medical weight loss specialist. ? ?Past Medical History:  ?Diagnosis Date  ? Abnormal Pap smear of cervix 2011  ? Dysmenorrhea   ? Leiomyoma of uterus   ? Menometrorrhagia   ? Seasonal allergies   ?  ? ?Past Surgical History:  ?Procedure Laterality Date  ? ABDOMINAL HYSTERECTOMY  11/13/2019  ? LEEP    ? VAGINAL DELIVERY    ? VAGINAL HYSTERECTOMY Bilateral 11/16/2019  ? Procedure: HYSTERECTOMY VAGINAL WITH SALPINGECTOMY;  Surgeon: Lavonia Drafts, MD;  Location: [facility];  Service: Gynecology;  Laterality: Bilateral;  ? ? ?Medications  ?Current Outpatient Medications on File Prior to Visit  ?Medication Sig Dispense Refill  ? Bacillus Coagulans-Inulin (ALIGN PREBIOTIC-PROBIOTIC PO) Take by mouth.    ? COVID-19 At Home Antigen Test [facility] COVID-19 HOME TEST) KIT Use as directed. 2 each 0  ? fluticasone (FLONASE) 50 MCG/ACT nasal spray Place 2 sprays into both nostrils daily. 16 g 6  ? omeprazole (PRILOSEC) 40 MG capsule Take 1 capsule (40 mg total) by mouth daily. 90 capsule 2  ? ? ?Allergies ?Allergies  ?Allergen Reactions  ? Ciprofloxacin   ?  SHOB  ? ? ?Review of Systems: ?Constitutional:  no unexpected weight changes ?Eye:  no recent significant change in  vision ?Ear/Nose/Mouth/Throat:  Ears:  no tinnitus or vertigo and no recent change in hearing ?Nose/Mouth/Throat:  no complaints of nasal congestion, no sore throat ?Cardiovascular: no chest pain ?Respiratory:  no cough and no shortness of breath ?Gastrointestinal:  no abdominal pain, no change in bowel habits ?GU:  Female: negative for dysuria or pelvic pain ?Musculoskeletal/Extremities:  no pain of the joints ?Integumentary (Skin/Breast):  no abnormal skin lesions reported ?Neurologic:  no headaches ?Endocrine:  denies fatigue ?Hematologic/Lymphatic:  No areas of easy bleeding ? ?Exam ?BP 120/76   Pulse 79   Temp 98.4 ?F (36.9 ?C) (Oral)   Ht '5\' 5"'  (1.651 m)   Wt 199 lb (90.3 kg)   LMP 10/16/2019   SpO2 99%   BMI 33.12 kg/m?  ?General:  well developed, well nourished, in no apparent distress ?Skin:  no significant moles, warts, or growths ?Head:  no masses, lesions, or tenderness ?Eyes:  pupils equal and round, sclera anicteric without injection ?Ears:  canals without lesions, TMs shiny without retraction, no obvious effusion, no erythema ?Nose:  nares patent, septum midline, mucosa normal, and no drainage or sinus tenderness ?Throat/Pharynx:  lips and gingiva without lesion; tongue and uvula midline; non-inflamed pharynx; no exudates or postnasal drainage ?Neck: neck supple without adenopathy, thyromegaly, or masses ?Lungs:  clear to auscultation, breath sounds equal bilaterally, no respiratory distress ?Cardio:  regular rate and rhythm, no bruits, no LE edema ?Abdomen:  abdomen soft, nontender; bowel  sounds normal; no masses or organomegaly ?Genital: Defer to GYN ?Musculoskeletal:  symmetrical muscle groups noted without atrophy or deformity ?Extremities:  no clubbing, cyanosis, or edema, no deformities, no skin discoloration ?Neuro:  gait normal; deep tendon reflexes normal and symmetric ?Psych: well oriented with normal range of affect and appropriate judgment/insight ? ?Assessment and Plan ? ?Well  adult exam - Plan: CBC, Comprehensive metabolic panel, Lipid panel ? ?Obesity (BMI 30-39.9) - Plan: Semaglutide-Weight Management 0.25 MG/0.5ML SOAJ, Semaglutide-Weight Management 0.5 MG/0.5ML SOAJ, Semaglutide-Weight Management 1 MG/0.5ML SOAJ, Semaglutide-Weight Management 1.7 MG/0.75ML SOAJ, Semaglutide-Weight Management 2.4 MG/0.75ML SOAJ  ? ?[name]. ?Counseled on diet and exercise. ?Advanced directive form provided today.  ?Other orders as above. ?Obesity: Chronic, not controlled.  Trial Wegovy.  Counseled on risks and benefits.  Offered referral to the medical weight loss team in addition to seeing a dietitian.  She does not weigh enough to have bariatric surgery coverage. Follow up in 6 weeks to recheck. ?The patient voiced understanding and agreement to the plan. ? ?[name], DO ?07/06/21 ?10:09 AM ? ?

## 2021-07-06 NOTE — Telephone Encounter (Signed)
Initiated PA for [employer] ?KEY: [id number] ?Waiting results ?

## 2021-07-09 NOTE — Telephone Encounter (Signed)
Received PA approval. ?Have not received PA approval information. ?Will update message when received. ?

## 2021-07-09 NOTE — Telephone Encounter (Signed)
The authorization is effective for a maximum of 7 fills from 07/09/2021 to 02/04/2022. ?As long as the patient is enrolled as a member of the current plan.  ?

## 2021-07-10 ENCOUNTER — Other Ambulatory Visit (HOSPITAL_COMMUNITY): Payer: Self-pay

## 2021-07-12 ENCOUNTER — Other Ambulatory Visit (HOSPITAL_COMMUNITY): Payer: Self-pay

## 2021-07-18 ENCOUNTER — Other Ambulatory Visit (HOSPITAL_COMMUNITY): Payer: Self-pay

## 2021-07-18 MED ORDER — COVID-19 AT HOME ANTIGEN TEST VI KIT
PACK | 0 refills | Status: DC
  Filled 2021-07-18: qty 4, 4d supply, fill #0

## 2021-07-19 ENCOUNTER — Other Ambulatory Visit (HOSPITAL_COMMUNITY): Payer: Self-pay

## 2021-07-19 ENCOUNTER — Other Ambulatory Visit: Payer: Self-pay | Admitting: Family Medicine

## 2021-07-19 DIAGNOSIS — E669 Obesity, unspecified: Secondary | ICD-10-CM
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
## 2021-07-19 MED ORDER — WEGOVY 0.25 MG/0.5ML ~~LOC~~ SOAJ
0.2500 mg | SUBCUTANEOUS | 0 refills | Status: AC
  Filled 2021-07-19: qty 2, 28d supply, fill #0

## 2021-07-24 ENCOUNTER — Other Ambulatory Visit (HOSPITAL_COMMUNITY): Payer: Self-pay

## 2021-07-26 ENCOUNTER — Other Ambulatory Visit (HOSPITAL_COMMUNITY): Payer: Self-pay

## 2021-07-27 ENCOUNTER — Other Ambulatory Visit (HOSPITAL_COMMUNITY): Payer: Self-pay

## 2021-07-30 ENCOUNTER — Other Ambulatory Visit (HOSPITAL_COMMUNITY): Payer: Self-pay

## 2021-08-14 ENCOUNTER — Other Ambulatory Visit (HOSPITAL_COMMUNITY): Payer: Self-pay

## 2021-08-17 ENCOUNTER — Other Ambulatory Visit (HOSPITAL_COMMUNITY): Payer: Self-pay

## 2021-08-17 ENCOUNTER — Encounter: Payer: Self-pay | Admitting: Family Medicine

## 2021-08-17 ENCOUNTER — Ambulatory Visit (INDEPENDENT_AMBULATORY_CARE_PROVIDER_SITE_OTHER): Payer: No Typology Code available for payment source | Admitting: Family Medicine

## 2021-08-17 VITALS — BP 118/70 | HR 81 | Temp 97.8°F | Ht 65.0 in | Wt 190.2 lb

## 2021-08-17 DIAGNOSIS — E669 Obesity, unspecified: Secondary | ICD-10-CM | POA: Diagnosis not present

## 2021-08-17 NOTE — Progress Notes (Signed)
Chief Complaint  Patient presents with   Follow-up    Subjective: Patient is a 31 y.o. female here for f/u.  Patient started on Wegovy for weight loss.  She is currently taking 0.5 mg weekly dosage.  She is tolerating well.  She did eat beyond satiety from 1 meal and felt terrible.  Otherwise she is just having some mild constipation.  She reports compliance and no other adverse effects.  She has lost around 9 pounds since her visit 6 weeks ago.  She is pleased with her results.  She is getting some walking and and recently got an indoor bike and plans to start cycling.  She is not lifting weights.  Diet is improved and appetite has been reduced.  Past Medical History:  Diagnosis Date   Abnormal Pap smear of cervix 2011   Dysmenorrhea    Leiomyoma of uterus    Menometrorrhagia    Seasonal allergies     Objective: BP 118/70   Pulse 81   Temp 97.8 F (36.6 C) (Oral)   Ht '5\' 5"'$  (1.651 m)   Wt 190 lb 4 oz (86.3 kg)   LMP 10/16/2019   BMI 31.66 kg/m  General: Awake, appears stated age Heart: RRR, no LE edema Lungs: CTAB, no rales, wheezes or rhonchi. No accessory muscle use Psych: Age appropriate judgment and insight, normal affect and mood  Assessment and Plan: Obesity (BMI 30-39.9)  Chronic, stable.  Continue Wegovy dosage escalation.  She will let me know if she reaches the maximally tolerated dosage prior to 2.4 mg weekly.  Counseled on diet and exercise.  Recommended she start some weight resistance exercises well.  I will see her in 6 months unless she needs me. The patient voiced understanding and agreement to the plan.  [place], DO 08/17/21  9:16 AM

## 2021-08-17 NOTE — Patient Instructions (Signed)
Keep going to the highest dosage your body will tolerated.   Very strong work with your weight loss.   Let us know if you need anything.

## 2021-09-03 ENCOUNTER — Other Ambulatory Visit (HOSPITAL_COMMUNITY): Payer: Self-pay

## 2021-09-19 ENCOUNTER — Other Ambulatory Visit (HOSPITAL_COMMUNITY): Payer: Self-pay

## 2021-10-02 ENCOUNTER — Other Ambulatory Visit: Payer: Self-pay | Admitting: Family Medicine

## 2021-10-02 ENCOUNTER — Other Ambulatory Visit (HOSPITAL_COMMUNITY): Payer: Self-pay

## 2021-10-02 DIAGNOSIS — E669 Obesity, unspecified: Secondary | ICD-10-CM

## 2021-10-02 MED ORDER — WEGOVY 2.4 MG/0.75ML ~~LOC~~ SOAJ
2.4000 mg | SUBCUTANEOUS | 3 refills | Status: DC
  Filled 2021-10-02 – 2021-10-12 (×3): qty 3, 28d supply, fill #0

## 2021-10-11 ENCOUNTER — Other Ambulatory Visit (HOSPITAL_COMMUNITY): Payer: Self-pay

## 2021-10-12 ENCOUNTER — Other Ambulatory Visit (HOSPITAL_COMMUNITY): Payer: Self-pay

## 2021-12-17 ENCOUNTER — Ambulatory Visit: Payer: No Typology Code available for payment source | Admitting: Obstetrics & Gynecology

## 2021-12-20 ENCOUNTER — Ambulatory Visit
Admission: EM | Admit: 2021-12-20 | Discharge: 2021-12-20 | Disposition: A | Discharge status: Home / Self Care | Payer: Medicaid Other | Attending: Family Medicine | Admitting: Family Medicine

## 2021-12-20 ENCOUNTER — Encounter: Payer: Self-pay | Admitting: Emergency Medicine

## 2021-12-20 DIAGNOSIS — R1030 Lower abdominal pain, unspecified: Secondary | ICD-10-CM

## 2021-12-20 LAB — POCT URINALYSIS DIP (MANUAL ENTRY)
Bilirubin, UA: NEGATIVE
Blood, UA: NEGATIVE
Glucose, UA: NEGATIVE mg/dL
Ketones, POC UA: NEGATIVE mg/dL
Leukocytes, UA: NEGATIVE
Nitrite, UA: NEGATIVE
Protein Ur, POC: NEGATIVE mg/dL
Spec Grav, UA: 1.02 (ref 1.010–1.025)
Urobilinogen, UA: 0.2 E.U./dL
pH, UA: 7 (ref 5.0–8.0)

## 2021-12-20 LAB — POCT URINE PREGNANCY: Preg Test, Ur: NEGATIVE

## 2021-12-20 NOTE — ED Triage Notes (Signed)
Pt is present today with lower abdominal pain radiating to her lower back. Pt sx started last Thursday

## 2021-12-20 NOTE — ED Provider Notes (Signed)
[place]   [id number] 12/20/21 Arrival Time: 3212  ASSESSMENT & PLAN:  1. Lower abdominal pain    Unclear etiology. Benign abdominal exam. No indications for urgent abdominal/pelvic imaging at this time. Discussed. Declines lab work. Has appt with PCP tomorrow. Comfortable with home observation.    Discharge Instructions      You have been seen today for abdominal pain. Your evaluation was not suggestive of any emergent condition requiring medical intervention at this time. However, some abdominal problems make take more time to appear. Therefore, it is very important for you to pay attention to any new symptoms or worsening of your current condition.  Please return here or to the Emergency Department immediately should you begin to feel worse in any way or have any of the following symptoms: increasing or different abdominal pain, persistent vomiting, inability to drink fluids, fevers, or shaking chills.       Follow-up Information     [name], DO.   Specialty: Family Medicine Why: Keep your appointment tommorrow. Contact information: [address] [place] [address] [phone]         [facility] EMERGENCY DEPARTMENT.   Specialty: Emergency Medicine Why: If symptoms worsen in any way. Contact information: [address] [id number] [place] [phone]               Reviewed expectations re: course of current medical issues. Questions answered. Outlined signs and symptoms indicating need for more acute intervention. Patient verbalized understanding. After Visit Summary given.   SUBJECTIVE: History from: patient. Penny Klein is a 31 y.o. female who presents with complaint of intermittent vague lower abdominal discomfort. First noted one week ago; no worsening. Afebrile. No n/v/d. Normal PO intake. Normal bowel/bladder habits.  Patient's last  menstrual period was 10/16/2019.  Past Surgical History:  Procedure Laterality Date   ABDOMINAL HYSTERECTOMY  11/13/2019   LEEP     VAGINAL DELIVERY     VAGINAL HYSTERECTOMY Bilateral 11/16/2019   Procedure: HYSTERECTOMY VAGINAL WITH SALPINGECTOMY;  Surgeon: Lavonia Drafts, MD;  Location: [facility];  Service: Gynecology;  Laterality: Bilateral;    OBJECTIVE:  Vitals:   12/20/21 1613  BP: (!) 139/90  Pulse: 80  Resp: 18  Temp: 97.9 F (36.6 C)  SpO2: 97%    General appearance: alert, oriented, no acute distress HEENT: [place]; AT; oropharynx moist Lungs: unlabored respirations Abdomen: soft; without distention; no specific tenderness to palpation; normal bowel sounds; without masses or organomegaly; without guarding or rebound tenderness Back: without reported CVA tenderness; FROM at waist Extremities: without LE edema; symmetrical; without gross deformities Skin: warm and dry Neurologic: normal gait Psychological: alert and cooperative; normal mood and affect  Labs: Results for orders placed or performed during the hospital encounter of 12/20/21  POCT urinalysis dipstick  Result Value Ref Range   Color, UA yellow yellow   Clarity, UA clear clear   Glucose, UA negative negative mg/dL   Bilirubin, UA negative negative   Ketones, POC UA negative negative mg/dL   Spec Grav, UA 1.020 1.010 - 1.025   Blood, UA negative negative   pH, UA 7.0 5.0 - 8.0   Protein Ur, POC negative negative mg/dL   Urobilinogen, UA 0.2 0.2 or 1.0 E.U./dL   Nitrite, UA Negative Negative   Leukocytes, UA Negative Negative  POCT urine pregnancy  Result Value Ref Range   Preg Test, Ur Negative Negative   Labs Reviewed  POCT URINALYSIS DIP (MANUAL ENTRY)  POCT URINE PREGNANCY    Allergies  Allergen Reactions   Ciprofloxacin     SHOB                                               Past Medical History:  Diagnosis Date   Abnormal Pap smear of cervix 2011   Dysmenorrhea     Leiomyoma of uterus    Menometrorrhagia    Seasonal allergies     Social History   Socioeconomic History   Marital status: Married    Spouse name: Not on file   Number of children: 3   Years of education: Not on file   Highest education level: Not on file  Occupational History   Not on file  Tobacco Use   Smoking status: Never   Smokeless tobacco: Never  Vaping Use   Vaping Use: Never used  Substance and Sexual Activity   Alcohol use: Not Currently    Comment: <1 per month   Drug use: No   Sexual activity: Not Currently    Birth control/protection: Surgical  Other Topics Concern   Not on file  Social History Narrative   Right Handed    Lives in a one story home    Social Determinants of Health   Financial Resource Strain: Not on file  Food Insecurity: Not on file  Transportation Needs: Not on file  Physical Activity: Not on file  Stress: Not on file  Social Connections: Not on file  Intimate Partner Violence: Not on file    Family History  Problem Relation Age of Onset   Lupus Paternal Uncle    Hypertension Mother    Hyperlipidemia Mother    Obesity Mother    Hypertension Father    Anxiety disorder Father    Hyperlipidemia Father    Breast cancer Maternal Aunt    Breast cancer Maternal Grandmother    Stomach cancer Paternal Grandmother    Breast cancer Maternal Herbert Moors, MD 12/20/21 1844

## 2021-12-20 NOTE — Discharge Instructions (Signed)

## 2021-12-21 ENCOUNTER — Encounter: Payer: Self-pay | Admitting: Family Medicine

## 2021-12-21 ENCOUNTER — Ambulatory Visit: Payer: Medicaid Other | Admitting: Family Medicine

## 2021-12-21 ENCOUNTER — Other Ambulatory Visit (HOSPITAL_BASED_OUTPATIENT_CLINIC_OR_DEPARTMENT_OTHER): Payer: Self-pay

## 2021-12-21 VITALS — BP 128/84 | HR 81 | Temp 98.2°F | Ht 65.0 in | Wt 180.1 lb

## 2021-12-21 DIAGNOSIS — R102 Pelvic and perineal pain: Secondary | ICD-10-CM

## 2021-12-21 DIAGNOSIS — Z23 Encounter for immunization: Secondary | ICD-10-CM

## 2021-12-21 DIAGNOSIS — M545 Low back pain, unspecified: Secondary | ICD-10-CM | POA: Diagnosis not present

## 2021-12-21 MED ORDER — TIZANIDINE HCL 4 MG PO TABS
4.0000 mg | ORAL_TABLET | Freq: Four times a day (QID) | ORAL | 0 refills | Status: DC | PRN
  Filled 2021-12-21: qty 30, 8d supply, fill #0

## 2021-12-21 NOTE — Patient Instructions (Signed)

## 2021-12-21 NOTE — Progress Notes (Signed)
Chief Complaint  Patient presents with   Abdominal Pain    For one week    Subjective: Patient is a 31 y.o. female here for pelvic pain.  1 week started having b/l pelvic pain radiating to her back. No urinary complaints. Describes pain as achy/cramping. No new sexual partners. No personal/famhx of kideny stones. Worse on R side.   Past Medical History:  Diagnosis Date   Abnormal Pap smear of cervix 2011   Dysmenorrhea    Leiomyoma of uterus    Menometrorrhagia    Seasonal allergies     Objective: BP 128/84 (BP Location: Left Arm, Patient Position: Sitting, Cuff Size: Normal)   Pulse 81   Temp 98.2 F (36.8 C) (Oral)   Ht '5\' 5"'$  (1.651 m)   Wt 180 lb 2 oz (81.7 kg)   LMP 10/16/2019   SpO2 99%   BMI 29.97 kg/m  General: Awake, appears stated age Heart: RRR, no LE edema Lungs: CTAB, no rales, wheezes or rhonchi. No accessory muscle use Abd: BS+, S, NT, ND MSK: Mild TTP over the lower lumbar paraspinals bilaterally, no SI joint TTP, no TTP over the hip flexors, decent range of motion of hamstrings bilaterally Neuro: DTRs equal and symmetric throughout, no clonus, no cerebellar signs, negative straight leg bilaterally, gait is normal Psych: Age appropriate judgment and insight, normal affect and mood  Assessment and Plan: Acute bilateral low back pain without sciatica - Plan: tiZANidine (ZANAFLEX) 4 MG tablet  Pelvic pain  Need for influenza vaccination - Plan: Flu Vaccine QUAD 6+ mos PF IM (Fluarix Quad PF)  Not obviously GU related.  Tylenol, as needed muscle relaxer, heat, ice, stretches and exercises.  If no improvement over the next 3 months, will pursue #2. If she does reach out to [name], we will check a pelvic ultrasound. Flu shot today. The patient voiced understanding and agreement to the plan.  [place], DO 12/21/21  2:52 PM

## 2022-02-18 ENCOUNTER — Encounter: Payer: Self-pay | Admitting: Family Medicine

## 2022-02-18 ENCOUNTER — Ambulatory Visit: Payer: Medicaid Other | Admitting: Family Medicine

## 2022-02-27 ENCOUNTER — Ambulatory Visit: Payer: Medicaid Other | Admitting: Obstetrics & Gynecology

## 2022-03-06 ENCOUNTER — Other Ambulatory Visit (HOSPITAL_BASED_OUTPATIENT_CLINIC_OR_DEPARTMENT_OTHER): Payer: Self-pay

## 2022-03-06 ENCOUNTER — Ambulatory Visit: Payer: Medicaid Other | Admitting: Family Medicine

## 2022-03-06 ENCOUNTER — Encounter: Payer: Self-pay | Admitting: Family Medicine

## 2022-03-06 VITALS — BP 126/86 | HR 76 | Temp 98.0°F | Resp 18 | Ht 65.0 in | Wt 184.0 lb

## 2022-03-06 DIAGNOSIS — F411 Generalized anxiety disorder: Secondary | ICD-10-CM | POA: Diagnosis not present

## 2022-03-06 DIAGNOSIS — K219 Gastro-esophageal reflux disease without esophagitis: Secondary | ICD-10-CM | POA: Diagnosis not present

## 2022-03-06 MED ORDER — CITALOPRAM HYDROBROMIDE 20 MG PO TABS
20.0000 mg | ORAL_TABLET | Freq: Every day | ORAL | 3 refills | Status: DC
  Filled 2022-03-06: qty 30, 30d supply, fill #0
  Filled 2022-03-31: qty 30, 30d supply, fill #1

## 2022-03-06 MED ORDER — HYDROXYZINE HCL 25 MG PO TABS
12.5000 mg | ORAL_TABLET | Freq: Three times a day (TID) | ORAL | 2 refills | Status: AC | PRN
  Filled 2022-03-06: qty 60, 7d supply, fill #0
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
## 2022-03-06 NOTE — Patient Instructions (Signed)
Keep the diet clean and stay active.  Please consider counseling. Contact [phone] to schedule an appointment or inquire about cost/insurance coverage.  [facility] located at [address], [place], [place].  Phone number = [phone].  Dr. Onoriode Edeh - Adult Psychiatry.    [facility] located at [address], [place], [place]. Phone number = [phone].   [facility] located at [address], [place], [place].  Phone number = [phone].   [facility] located at [address], [place], [place].  Phone number = [phone].   Let us know if you need anything.  

## 2022-03-25 ENCOUNTER — Encounter: Payer: Self-pay | Admitting: Family Medicine

## 2022-03-25 ENCOUNTER — Ambulatory Visit (INDEPENDENT_AMBULATORY_CARE_PROVIDER_SITE_OTHER): Payer: Medicaid Other | Admitting: Family Medicine

## 2022-03-25 VITALS — BP 118/76 | HR 71 | Ht 65.0 in | Wt 188.0 lb

## 2022-03-25 DIAGNOSIS — Z01419 Encounter for gynecological examination (general) (routine) without abnormal findings: Secondary | ICD-10-CM

## 2022-03-25 DIAGNOSIS — R109 Unspecified abdominal pain: Secondary | ICD-10-CM

## 2022-03-25 DIAGNOSIS — K5901 Slow transit constipation: Secondary | ICD-10-CM

## 2022-03-25 NOTE — Patient Instructions (Signed)

## 2022-03-25 NOTE — Assessment & Plan Note (Signed)
99212 - Continue miralax, may increase to bid. Also, add fiber, increase water.

## 2022-03-25 NOTE — Progress Notes (Signed)
Subjective:     Penny Klein is a 32 y.o. female and is here for a comprehensive physical exam. The patient reports problems - Left sided abdominal pain and constipation. No cycle since TVH. No hot flashes.   The following portions of the patient's history were reviewed and updated as appropriate: allergies, current medications, past family history, past medical history, past social history, past surgical history, and problem list.  Review of Systems Pertinent items noted in HPI and remainder of comprehensive ROS otherwise negative.   Objective:    BP 118/76   Pulse 71   Ht '5\' 5"'$  (1.651 m)   Wt 188 lb (85.3 kg)   LMP 10/16/2019   BMI 31.28 kg/m  General appearance: alert, cooperative, and appears stated age Head: Normocephalic, without obvious abnormality, atraumatic Neck: no adenopathy, supple, symmetrical, trachea midline, and thyroid not enlarged, symmetric, no tenderness/mass/nodules Lungs: clear to auscultation bilaterally Breasts: normal appearance, no masses or tenderness Heart: regular rate and rhythm, S1, S2 normal, no murmur, click, rub or gallop Abdomen: soft, non-tender; bowel sounds normal; no masses,  no organomegaly Pelvic: external genitalia normal, uterus surgically absent, and vagina normal without discharge Extremities: extremities normal, atraumatic, no cyanosis or edema Pulses: 2+ and symmetric Skin: Skin color, texture, turgor normal. No rashes or lesions Lymph nodes: Cervical, supraclavicular, and axillary nodes normal. Neurologic: Grossly normal    Assessment:    Healthy female exam.      Plan:   Problem List Items Addressed This Visit       Unprioritized   Constipation by delayed colonic transit    99212 - Continue miralax, may increase to bid. Also, add fiber, increase water.      Right sided abdominal pain    99212 -  keep diary of triggers, symptoms. May be related to constipation, but may be GB disease or related to GERD/PUD.       Other Visit Diagnoses     Encounter for gynecological examination without abnormal finding    -  Primary      Return in 1 year (on 03/26/2023).    See After Visit Summary for Counseling Recommendations

## 2022-03-25 NOTE — Assessment & Plan Note (Signed)
99212 -  keep diary of triggers, symptoms. May be related to constipation, but may be GB disease or related to GERD/PUD.

## 2022-04-01 ENCOUNTER — Encounter (HOSPITAL_COMMUNITY): Payer: Self-pay

## 2022-04-01 ENCOUNTER — Other Ambulatory Visit (HOSPITAL_COMMUNITY): Payer: Self-pay

## 2022-04-03 ENCOUNTER — Other Ambulatory Visit: Payer: Self-pay

## 2022-04-08 ENCOUNTER — Ambulatory Visit: Payer: Medicaid Other | Admitting: Family Medicine

## 2022-04-08 ENCOUNTER — Other Ambulatory Visit (HOSPITAL_BASED_OUTPATIENT_CLINIC_OR_DEPARTMENT_OTHER): Payer: Self-pay

## 2022-04-08 ENCOUNTER — Encounter: Payer: Self-pay | Admitting: Family Medicine

## 2022-04-08 VITALS — BP 110/68 | HR 79 | Temp 97.9°F | Ht 65.0 in | Wt 185.0 lb

## 2022-04-08 DIAGNOSIS — F411 Generalized anxiety disorder: Secondary | ICD-10-CM | POA: Diagnosis not present

## 2022-04-08 MED ORDER — CITALOPRAM HYDROBROMIDE 20 MG PO TABS
20.0000 mg | ORAL_TABLET | Freq: Every day | ORAL | 2 refills | Status: DC
  Filled 2022-04-08 – 2022-06-24 (×2): qty 90, 90d supply, fill #0

## 2022-04-08 NOTE — Patient Instructions (Addendum)
Coping skills Choose 5 that work for you: Take a deep breath Count to 20 Read a book Do a puzzle Meditate [place] outside Call a friend Listen to music Take a walk Color Send a note Take a bath Watch a movie Be alone in a quiet place Pet an animal Visit a friend Journal Exercise Stretch   Claritin (loratadine), Allegra (fexofenadine), Zyrtec (cetirizine) which is also equivalent to Xyzal (levocetirizine); these are listed in order from weakest to strongest. Generic, and therefore cheaper, options are in the parentheses.   There are available OTC, and the generic versions, which may be cheaper, are in parentheses. Show this to a pharmacist if you have trouble finding any of these items.  Let us know if you need anything.

## 2022-04-08 NOTE — Progress Notes (Signed)
Chief Complaint  Patient presents with   Follow-up    Subjective Penny Klein presents for f/u anxiety.  Pt is currently being treated with Celexa 20 mg/d.  Reports 50-60% improvement since treatment. Has not needed the hydroxyzine.  No thoughts of harming self or others. No self-medication with alcohol, prescription drugs or illicit drugs. Pt is not following with a counselor/psychologist.  Past Medical History:  Diagnosis Date   Abnormal Pap smear of cervix 2011   Dysmenorrhea    Leiomyoma of uterus    Menometrorrhagia    Seasonal allergies    Allergies as of 04/08/2022       Reactions   Ciprofloxacin    [facility]        Medication List        Accurate as of April 08, 2022  7:47 AM. If you have any questions, ask your nurse or doctor.          citalopram 20 MG tablet Commonly known as: CELEXA Take 1 tablet (20 mg total) by mouth daily. Take 1/2 tab daily for the first 2 weeks.   fluticasone 50 MCG/ACT nasal spray Commonly known as: FLONASE Place 2 sprays into both nostrils daily.   hydrOXYzine 25 MG tablet Commonly known as: ATARAX Take 0.5-3 tablets (12.5-75 mg total) by mouth 3 (three) times daily as needed for anxiety.   tiZANidine 4 MG tablet Commonly known as: Zanaflex Take 1 tablet (4 mg total) by mouth every 6 (six) hours as needed for muscle spasms.        Exam BP 110/68 (BP Location: Left Arm, Patient Position: Sitting, Cuff Size: Normal)   Pulse 79   Temp 97.9 F (36.6 C) (Oral)   Ht '5\' 5"'$  (1.651 m)   Wt 185 lb (83.9 kg)   LMP 10/16/2019   SpO2 97%   BMI 30.79 kg/m  General:  well developed, well nourished, in no apparent distress Lungs:  No respiratory distress Psych: well oriented with normal range of affect and age-appropriate judgement/insight, alert and oriented x4.  Assessment and Plan  GAD (generalized anxiety disorder)  Chronic, stable. Cont Celexa 20 mg/d. F/u in 3 mo for CPE or prn. The patient voiced understanding  and agreement to the plan.  [place], DO 04/08/22 7:47 AM

## 2022-04-15 ENCOUNTER — Other Ambulatory Visit (HOSPITAL_BASED_OUTPATIENT_CLINIC_OR_DEPARTMENT_OTHER): Payer: Self-pay

## 2022-05-13 ENCOUNTER — Ambulatory Visit (INDEPENDENT_AMBULATORY_CARE_PROVIDER_SITE_OTHER): Payer: Medicaid Other

## 2022-05-13 VITALS — BP 117/75 | HR 76 | Wt 191.0 lb

## 2022-05-13 DIAGNOSIS — R399 Unspecified symptoms and signs involving the genitourinary system: Secondary | ICD-10-CM

## 2022-05-13 DIAGNOSIS — R300 Dysuria: Secondary | ICD-10-CM | POA: Diagnosis not present

## 2022-05-13 LAB — POCT URINALYSIS DIPSTICK
Bilirubin, UA: NEGATIVE
Glucose, UA: NEGATIVE
Ketones, UA: NEGATIVE
Leukocytes, UA: NEGATIVE
Nitrite, UA: NEGATIVE
Protein, UA: NEGATIVE
Spec Grav, UA: 1.015 (ref 1.010–1.025)
Urobilinogen, UA: 0.2 E.U./dL
pH, UA: 7 (ref 5.0–8.0)

## 2022-05-13 NOTE — Progress Notes (Signed)
SUBJECTIVE: Penny Klein is a 32 y.o. female who complains of urinary frequency, urgency and dysuria x 2 days, without flank pain, fever, chills, or abnormal vaginal discharge or bleeding.   OBJECTIVE: Appears well, in no apparent distress.  Vital signs are normal. Urine dipstick shows negative for all components.    ASSESSMENT: Dysuria  PLAN: Treatment per orders.  Call or return to clinic prn if these symptoms worsen or fail to improve as anticipated.  Penny Klein l Timmy Cleverly, CMA

## 2022-05-15 LAB — URINE CULTURE

## 2022-06-24 ENCOUNTER — Encounter: Payer: Self-pay | Admitting: *Deleted

## 2022-06-25 ENCOUNTER — Other Ambulatory Visit (HOSPITAL_BASED_OUTPATIENT_CLINIC_OR_DEPARTMENT_OTHER): Payer: Self-pay

## 2022-07-04 ENCOUNTER — Other Ambulatory Visit (HOSPITAL_BASED_OUTPATIENT_CLINIC_OR_DEPARTMENT_OTHER): Payer: Self-pay

## 2022-07-09 ENCOUNTER — Encounter: Payer: Self-pay | Admitting: Family Medicine

## 2022-07-09 ENCOUNTER — Encounter: Payer: Medicaid Other | Admitting: Family Medicine

## 2022-07-24 ENCOUNTER — Ambulatory Visit (INDEPENDENT_AMBULATORY_CARE_PROVIDER_SITE_OTHER): Payer: Medicaid Other | Admitting: Family Medicine

## 2022-07-24 ENCOUNTER — Encounter: Payer: Self-pay | Admitting: Family Medicine

## 2022-07-24 VITALS — BP 108/70 | HR 78 | Temp 97.4°F | Ht 65.0 in | Wt 197.0 lb

## 2022-07-24 DIAGNOSIS — Z0000 Encounter for general adult medical examination without abnormal findings: Secondary | ICD-10-CM | POA: Diagnosis not present

## 2022-07-24 LAB — COMPREHENSIVE METABOLIC PANEL
ALT: 19 U/L (ref 0–35)
AST: 29 U/L (ref 0–37)
Albumin: 4.7 g/dL (ref 3.5–5.2)
Alkaline Phosphatase: 81 U/L (ref 39–117)
BUN: 17 mg/dL (ref 6–23)
CO2: 28 mEq/L (ref 19–32)
Calcium: 9.7 mg/dL (ref 8.4–10.5)
Chloride: 100 mEq/L (ref 96–112)
Creatinine, Ser: 0.69 mg/dL (ref 0.40–1.20)
GFR: 114.45 mL/min (ref >60.00)
Glucose, Bld: 71 mg/dL (ref 70–99)
Potassium: 4.2 mEq/L (ref 3.5–5.1)
Sodium: 138 mEq/L (ref 135–145)
Total Bilirubin: 0.5 mg/dL (ref 0.2–1.2)
Total Protein: 8 g/dL (ref 6.0–8.3)

## 2022-07-24 LAB — CBC
HCT: 42.1 % (ref 36.0–46.0)
Hemoglobin: 14 g/dL (ref 12.0–15.0)
MCHC: 33.2 g/dL (ref 30.0–36.0)
MCV: 90.2 fl (ref 78.0–100.0)
Platelets: 339 10*3/uL (ref 150.0–400.0)
RBC: 4.67 Mil/uL (ref 3.87–5.11)
RDW: 14.2 % (ref 11.5–15.5)
WBC: 9.2 10*3/uL (ref 4.0–10.5)

## 2022-07-24 LAB — LIPID PANEL
Cholesterol: 197 mg/dL (ref 0–200)
HDL: 88.6 mg/dL (ref >39.00)
LDL Cholesterol: 81 mg/dL (ref 0–99)
NonHDL: 108.59
Total CHOL/HDL Ratio: 2
Triglycerides: 139 mg/dL (ref 0.0–149.0)
VLDL: 27.8 mg/dL (ref 0.0–40.0)

## 2022-07-24 NOTE — Progress Notes (Signed)
Chief Complaint  Patient presents with   Annual Exam     Well Woman Penny Klein is here for a complete physical.   Her last physical was >1 year ago.  Current diet: in general, diet improving. Current exercise: [place] [facility]. Contraception? Yes Fatigue out of ordinary? No Seatbelt? Yes Advanced directive? No  Health Maintenance Pap/HPV- Had hysterectomy Tetanus- Yes HIV screening- Yes Hep C screening- Yes  Past Medical History:  Diagnosis Date   Abnormal Pap smear of cervix 2011   Dysmenorrhea    Leiomyoma of uterus    Menometrorrhagia    Seasonal allergies      Past Surgical History:  Procedure Laterality Date   LEEP     VAGINAL DELIVERY     VAGINAL HYSTERECTOMY Bilateral 11/16/2019   Procedure: HYSTERECTOMY VAGINAL WITH SALPINGECTOMY;  Surgeon: Willodean Rosenthal, MD;  Location: [facility];  Service: Gynecology;  Laterality: Bilateral;    Medications  Current Outpatient Medications on File Prior to Visit  Medication Sig Dispense Refill   citalopram (CELEXA) 20 MG tablet Take 1 tablet (20 mg total) by mouth daily. 90 tablet 2   fluticasone (FLONASE) 50 MCG/ACT nasal spray Place 2 sprays into both nostrils daily. 16 g 6   hydrOXYzine (ATARAX) 25 MG tablet Take 0.5-3 tablets (12.5-75 mg total) by mouth 3 (three) times daily as needed for anxiety. 60 tablet 2    Allergies Allergies  Allergen Reactions   Ciprofloxacin     SHOB    Review of Systems: Constitutional:  no unexpected weight changes Eye:  no recent significant change in vision Ear/Nose/Mouth/Throat:  Ears:  no tinnitus or vertigo and no recent change in hearing Nose/Mouth/Throat:  no complaints of nasal congestion, no sore throat Cardiovascular: no chest pain Respiratory:  no cough and no shortness of breath Gastrointestinal:  no abdominal pain, no change in bowel habits GU:  Female: negative for dysuria or pelvic pain Musculoskeletal/Extremities:  +R heel  pain Integumentary (Skin/Breast):  no abnormal skin lesions reported Neurologic:  no headaches Endocrine:  denies fatigue Hematologic/Lymphatic:  No areas of easy bleeding  Exam BP 108/70 (BP Location: Left Arm, Patient Position: Sitting, Cuff Size: Normal)   Pulse 78   Temp (!) 97.4 F (36.3 C) (Oral)   Ht 5\' 5"  (1.651 m)   Wt 197 lb (89.4 kg)   LMP 10/16/2019   SpO2 99%   BMI 32.78 kg/m  General:  well developed, well nourished, in no apparent distress Skin:  no significant moles, warts, or growths Head:  no masses, lesions, or tenderness Eyes:  pupils equal and round, sclera anicteric without injection Ears:  canals without lesions, TMs shiny without retraction, no obvious effusion, no erythema Nose:  nares patent, mucosa normal, and no drainage  Throat/Pharynx:  lips and gingiva without lesion; tongue and uvula midline; non-inflamed pharynx; no exudates or postnasal drainage Neck: neck supple without adenopathy, thyromegaly, or masses Lungs:  clear to auscultation, breath sounds equal bilaterally, no respiratory distress Cardio:  regular rate and rhythm, no bruits, no LE edema Abdomen:  abdomen soft, nontender; bowel sounds normal; no masses or organomegaly Genital: Defer to GYN Musculoskeletal:  +ttp over medial calcaneus on R without deformity, flat foot noted; symmetrical muscle groups noted without atrophy or deformity Extremities:  no clubbing, cyanosis, or edema, no deformities, no skin discoloration Neuro:  gait normal; deep tendon reflexes normal and symmetric Psych: well oriented with normal range of affect and appropriate judgment/insight  Assessment and Plan  Well adult  exam - Plan: CBC, Comprehensive metabolic panel, Lipid panel   Well 32 y.o. female. Counseled on diet and exercise. She has started to exercise and lose inches.  Advanced directive form provided today.  Other orders as above. Pes planus: arch supports rec'd. Send message in 3-4 weeks if no  better and will refer to podiatry.  Follow up in 6 mo. The patient voiced understanding and agreement to the plan.  Jilda Roche [place], DO 07/24/22 1:55 PM

## 2022-07-24 NOTE — Patient Instructions (Addendum)
Give [name] 2-3 business days to get the results of your labs back.   Keep the diet clean and stay active.  Please get me a copy of your advanced directive form at your convenience.   Try to drink 55-60 oz of water daily outside of exercise.  Consider 70-75 g of protein daily.   Ice/cold pack over area for 10-15 min twice daily.  OK to take Tylenol 1000 mg (2 extra strength tabs) or 975 mg (3 regular strength tabs) every 6 hours as needed.  Consider Powerstep insoles. There are very quality over the counter inserts. Shop around online and in stores. Dr. Margart Sickles is a cheaper alternative, though is not as high of quality.   Let us know if you need anything.

## 2022-11-05 ENCOUNTER — Encounter: Payer: Self-pay | Admitting: Emergency Medicine

## 2022-11-05 ENCOUNTER — Other Ambulatory Visit: Payer: Self-pay

## 2022-11-05 ENCOUNTER — Emergency Department
Admission: EM | Admit: 2022-11-05 | Discharge: 2022-11-06 | Disposition: A | Discharge status: Home / Self Care | Payer: Medicaid Other | Attending: Emergency Medicine | Admitting: Emergency Medicine

## 2022-11-05 DIAGNOSIS — R197 Diarrhea, unspecified: Secondary | ICD-10-CM | POA: Insufficient documentation

## 2022-11-05 DIAGNOSIS — R1030 Lower abdominal pain, unspecified: Secondary | ICD-10-CM | POA: Diagnosis not present

## 2022-11-05 DIAGNOSIS — R195 Other fecal abnormalities: Secondary | ICD-10-CM

## 2022-11-05 DIAGNOSIS — R109 Unspecified abdominal pain: Secondary | ICD-10-CM | POA: Diagnosis present

## 2022-11-05 LAB — CBC
HCT: 41.3 % (ref 36.0–46.0)
Hemoglobin: 13.7 g/dL (ref 12.0–15.0)
MCH: 29.1 pg (ref 26.0–34.0)
MCHC: 33.2 g/dL (ref 30.0–36.0)
MCV: 87.7 fL (ref 80.0–100.0)
Platelets: 295 10*3/uL (ref 150–400)
RBC: 4.71 MIL/uL (ref 3.87–5.11)
RDW: 12.6 % (ref 11.5–15.5)
WBC: 9.6 10*3/uL (ref 4.0–10.5)
nRBC: 0 % (ref 0.0–0.2)

## 2022-11-05 LAB — URINALYSIS, ROUTINE W REFLEX MICROSCOPIC
Bilirubin Urine: NEGATIVE
Glucose, UA: NEGATIVE mg/dL
Ketones, ur: NEGATIVE mg/dL
Leukocytes,Ua: NEGATIVE
Nitrite: NEGATIVE
Protein, ur: NEGATIVE mg/dL
Specific Gravity, Urine: 1.03 (ref 1.005–1.030)
pH: 5 (ref 5.0–8.0)

## 2022-11-05 LAB — COMPREHENSIVE METABOLIC PANEL
ALT: 19 U/L (ref 0–44)
AST: 21 U/L (ref 15–41)
Albumin: 4.3 g/dL (ref 3.5–5.0)
Alkaline Phosphatase: 67 U/L (ref 38–126)
Anion gap: 7 (ref 5–15)
BUN: 16 mg/dL (ref 6–20)
CO2: 25 mmol/L (ref 22–32)
Calcium: 9.1 mg/dL (ref 8.9–10.3)
Chloride: 106 mmol/L (ref 98–111)
Creatinine, Ser: 0.76 mg/dL (ref 0.44–1.00)
GFR, Estimated: >60 mL/min (ref >60)
Glucose, Bld: 93 mg/dL (ref 70–99)
Potassium: 3.7 mmol/L (ref 3.5–5.1)
Sodium: 138 mmol/L (ref 135–145)
Total Bilirubin: 0.6 mg/dL (ref 0.3–1.2)
Total Protein: 7.9 g/dL (ref 6.5–8.1)

## 2022-11-05 LAB — LIPASE, BLOOD: Lipase: 34 U/L (ref 11–51)

## 2022-11-05 NOTE — ED Triage Notes (Signed)
Pt presents ambulatory to triage via POV with complaints of lower abdominal pain x 3 weeks. Pt describes the pain as intermittent cramping and has associated nausea when her pain is intense. Pt notes taking Tylenol PTA without improvement in her sx. A&Ox4 at this time. Denies CP or SOB.

## 2022-11-06 ENCOUNTER — Other Ambulatory Visit (HOSPITAL_BASED_OUTPATIENT_CLINIC_OR_DEPARTMENT_OTHER): Payer: Self-pay

## 2022-11-06 ENCOUNTER — Emergency Department: Payer: Medicaid Other

## 2022-11-06 MED ORDER — ONDANSETRON HCL 4 MG PO TABS
4.0000 mg | ORAL_TABLET | Freq: Every day | ORAL | 1 refills | Status: AC | PRN
  Filled 2022-11-06: qty 30, 30d supply, fill #0

## 2022-11-06 MED ORDER — ONDANSETRON 4 MG PO TBDP
4.0000 mg | ORAL_TABLET | Freq: Once | ORAL | Status: AC
  Administered 2022-11-06: 4 mg via ORAL
  Filled 2022-11-06: qty 1

## 2022-11-06 NOTE — Discharge Instructions (Addendum)
Fortunately your testing in the emergency department did not show any emergency conditions to account for your symptoms.  Take acetaminophen 650 mg and ibuprofen 400 mg every 6 hours for pain.  Take with food.  Thank you for choosing [name] for your health care today!  Please see your primary doctor this week for a follow up appointment.   If you have any new, worsening, or unexpected symptoms call your doctor right away or come back to the emergency department for reevaluation.  It was my pleasure to care for you today.   Daneil Dan [place] Charon, MD

## 2022-11-06 NOTE — ED Provider Notes (Signed)
[facility] Provider Note    Event Date/Time   First MD Initiated Contact with Patient 11/05/22 2351     (approximate)   History   Abdominal Pain   HPI  Penny Klein is a 32 y.o. female   Past medical history of fibroid uterus status post hysterectomy and salpingectomy, who presents to the emergency department with abdominal cramping pain for the last 3 weeks.  Associated with some loose stools.  She denies any dysuria or frequency.  She denies any vaginal discharge vaginal bleeding.  No fever or chills.  External Medical Documents Reviewed: Surgery note by gynecology from 2021 for hysterectomy and salpingectomy      Physical Exam   Triage Vital Signs: ED Triage Vitals  Encounter Vitals Group     BP 11/05/22 2128 128/88     Systolic BP Percentile --      Diastolic BP Percentile --      Pulse Rate 11/05/22 2128 76     Resp 11/05/22 2128 18     Temp 11/05/22 2128 98.2 F (36.8 C)     Temp Source 11/05/22 2128 Oral     SpO2 11/05/22 2128 100 %     Weight 11/05/22 2127 180 lb (81.6 kg)     Height 11/05/22 2127 5\' 5"  (1.651 m)     Head Circumference --      Peak Flow --      Pain Score 11/05/22 2127 4     Pain Loc --      Pain Education --      Exclude from Growth Chart --     Most recent vital signs: Vitals:   11/06/22 0055 11/06/22 0100  BP: 116/79   Pulse:    Resp: 18 18  Temp:    SpO2:      General: Awake, no distress.  CV:  Good peripheral perfusion.  Resp:  Normal effort.  Abd:  No distention.  Other:  Awake alert comfortable appearing with a soft benign abdominal exam to deep palpation all quadrants, normal vital signs and afebrile.   ED Results / Procedures / Treatments   Labs (all labs ordered are listed, but only abnormal results are displayed) Labs Reviewed  URINALYSIS, ROUTINE W REFLEX MICROSCOPIC - Abnormal; Notable for the following components:      Result Value   Color, Urine YELLOW (*)    APPearance HAZY  (*)    Hgb urine dipstick SMALL (*)    Bacteria, UA RARE (*)    All other components within normal limits  LIPASE, BLOOD  COMPREHENSIVE METABOLIC PANEL  CBC     I ordered and reviewed the above labs they are notable for cell counts electrolytes within normal limits.    RADIOLOGY I independently reviewed and interpreted pelvic ultrasound to see normal-appearing ovaries with good blood flow I also reviewed radiologist's formal read.   PROCEDURES:  Critical Care performed: No  Procedures   MEDICATIONS ORDERED IN ED: Medications  ondansetron (ZOFRAN-ODT) disintegrating tablet 4 mg (4 mg Oral Given 11/06/22 0054)     IMPRESSION / MDM / ASSESSMENT AND PLAN / ED COURSE  I reviewed the triage vital signs and the nursing notes.                                Patient's presentation is most consistent with acute presentation with potential threat to life or bodily function.  Differential diagnosis includes,  but is not limited to, ovarian torsion, urinary tract infection, appendicitis, viral gastroenteritis   The patient is on the cardiac monitor to evaluate for evidence of arrhythmia and/or significant heart rate changes.  MDM:    Intermittent abdominal cramping pain associate with diarrhea and this patient status post hysterectomy and bilateral salpingectomy.  No urinary symptoms suggest UTI, and urinalysis not consistent with UTI.  May be due to viral gastroenteritis, prolonged course, patient is concerned about her ovaries being torsed.  Ordered transvaginal ultrasound to check for ovarian torsion which fortunately looks normal.    Given soft benign abdominal exam, no fever, 3 weeks duration of intermittent cramping pain, and no leukocytosis I doubt appendicitis or other surgical infectious abdominal pathology in this otherwise well-appearing patient.    Given unremarkable workup as above, she will be discharged at this time follow-up with PMD.        FINAL CLINICAL  IMPRESSION(S) / ED DIAGNOSES   Final diagnoses:  Lower abdominal pain  Loose stools     Rx / DC Orders   ED Discharge Orders          Ordered    ondansetron (ZOFRAN) 4 MG tablet  Daily PRN        11/06/22 0042             Note:  This document was prepared using Dragon voice recognition software and may include unintentional dictation errors.    Pilar Jarvis, MD 11/06/22 [phone]

## 2022-11-25 ENCOUNTER — Ambulatory Visit: Payer: Medicaid Other | Admitting: Family Medicine

## 2022-11-26 ENCOUNTER — Other Ambulatory Visit (HOSPITAL_BASED_OUTPATIENT_CLINIC_OR_DEPARTMENT_OTHER): Payer: Self-pay

## 2023-01-24 ENCOUNTER — Ambulatory Visit: Payer: Medicaid Other | Admitting: Family Medicine

## 2023-01-31 ENCOUNTER — Other Ambulatory Visit (HOSPITAL_BASED_OUTPATIENT_CLINIC_OR_DEPARTMENT_OTHER): Payer: Self-pay

## 2023-01-31 ENCOUNTER — Ambulatory Visit (INDEPENDENT_AMBULATORY_CARE_PROVIDER_SITE_OTHER): Payer: Medicaid Other | Admitting: Family Medicine

## 2023-01-31 ENCOUNTER — Telehealth: Payer: Self-pay

## 2023-01-31 ENCOUNTER — Encounter: Payer: Self-pay | Admitting: Family Medicine

## 2023-01-31 VITALS — BP 124/82 | HR 83 | Temp 98.0°F | Resp 16 | Ht 65.0 in | Wt 212.0 lb

## 2023-01-31 DIAGNOSIS — E6609 Other obesity due to excess calories: Secondary | ICD-10-CM | POA: Diagnosis not present

## 2023-01-31 DIAGNOSIS — Z23 Encounter for immunization: Secondary | ICD-10-CM

## 2023-01-31 DIAGNOSIS — E66811 Obesity, class 1: Secondary | ICD-10-CM

## 2023-01-31 DIAGNOSIS — Z6833 Body mass index (BMI) 33.0-33.9, adult: Secondary | ICD-10-CM | POA: Diagnosis not present

## 2023-01-31 DIAGNOSIS — F411 Generalized anxiety disorder: Secondary | ICD-10-CM | POA: Diagnosis not present

## 2023-01-31 MED ORDER — CITALOPRAM HYDROBROMIDE 10 MG PO TABS
10.0000 mg | ORAL_TABLET | Freq: Every day | ORAL | 3 refills | Status: AC
  Filled 2023-01-31: qty 30, 30d supply, fill #0
  Filled 2023-04-01: qty 30, 30d supply, fill #1
  Filled 2023-04-27: qty 30, 30d supply, fill #2

## 2023-01-31 MED ORDER — SEMAGLUTIDE-WEIGHT MANAGEMENT 2.4 MG/0.75ML ~~LOC~~ SOAJ
2.4000 mg | SUBCUTANEOUS | 0 refills | Status: AC
Start: 2023-05-27 — End: 2023-06-30
  Filled 2023-01-31 – 2023-06-02 (×2): qty 3, 28d supply, fill #0

## 2023-01-31 MED ORDER — SEMAGLUTIDE-WEIGHT MANAGEMENT 1 MG/0.5ML ~~LOC~~ SOAJ
1.0000 mg | SUBCUTANEOUS | 0 refills | Status: AC
Start: 2023-03-30 — End: 2023-04-30
  Filled 2023-01-31 – 2023-04-01 (×2): qty 2, 28d supply, fill #0

## 2023-01-31 MED ORDER — SEMAGLUTIDE-WEIGHT MANAGEMENT 1.7 MG/0.75ML ~~LOC~~ SOAJ
1.7000 mg | SUBCUTANEOUS | 0 refills | Status: AC
Start: 2023-04-28 — End: 2023-05-26
  Filled 2023-01-31 – 2023-04-28 (×2): qty 3, 28d supply, fill #0

## 2023-01-31 MED ORDER — SEMAGLUTIDE-WEIGHT MANAGEMENT 0.5 MG/0.5ML ~~LOC~~ SOAJ
0.5000 mg | SUBCUTANEOUS | 0 refills | Status: AC
Start: 2023-03-01 — End: 2023-03-29
  Filled 2023-01-31 – 2023-02-28 (×2): qty 2, 28d supply, fill #0

## 2023-01-31 MED ORDER — SEMAGLUTIDE-WEIGHT MANAGEMENT 0.25 MG/0.5ML ~~LOC~~ SOAJ
0.2500 mg | SUBCUTANEOUS | 0 refills | Status: AC
Start: 2023-01-31 — End: 2023-03-03
  Filled 2023-01-31: qty 2, 28d supply, fill #0

## 2023-01-31 NOTE — Patient Instructions (Signed)
Keep the diet clean and stay active.  Aim to do some physical exertion for 150 minutes per week. This is typically divided into 5 days per week, 30 minutes per day. The activity should be enough to get your heart rate up. Anything is better than nothing if you have time constraints.  Please consider adding some weight resistance exercise to your routine. Consider yoga as well.   Let us know if you need anything.

## 2023-01-31 NOTE — Progress Notes (Signed)
Chief Complaint  Patient presents with   Follow-up    Follow up    Subjective Penny Klein presents for f/u anxiety/depression.  Pt is currently being treated with hydroxyzine prn. Stopped Celexa 20 mg/d. Reports some irritability since treatment. No thoughts of harming self or others. No self-medication with alcohol, prescription drugs or illicit drugs. Pt is not following with a counselor/psychologist.  Obesity Struggling with weight. Diet is OK. She was on Wegovy in the past which worked well. She is not currently on a medication. She is walking for exercise. No Cp or SOB.   Past Medical History:  Diagnosis Date   Abnormal Pap smear of cervix 2011   Dysmenorrhea    Leiomyoma of uterus    Menometrorrhagia    Seasonal allergies    Allergies as of 01/31/2023       Reactions   Ciprofloxacin    [facility]        Medication List        Accurate as of January 31, 2023  9:57 AM. If you have any questions, ask your nurse or doctor.          citalopram 10 MG tablet Commonly known as: CELEXA Take 1 tablet (10 mg total) by mouth daily. What changed:  medication strength how much to take Changed by: Jilda Roche Zaidan Keeble   fluticasone 50 MCG/ACT nasal spray Commonly known as: FLONASE Place 2 sprays into both nostrils daily.   hydrOXYzine 25 MG tablet Commonly known as: ATARAX Take 0.5-3 tablets (12.5-75 mg total) by mouth 3 (three) times daily as needed for anxiety.   ondansetron 4 MG tablet Commonly known as: Zofran Take 1 tablet (4 mg total) by mouth daily as needed.   Semaglutide-Weight Management 0.25 MG/0.5ML Soaj Inject 0.25 mg into the skin once a week for 28 days. Started by: Sharlene Dory   Semaglutide-Weight Management 0.5 MG/0.5ML Soaj Inject 0.5 mg into the skin once a week for 28 days. Start taking on: March 01, 2023 Started by: Sharlene Dory   Semaglutide-Weight Management 1 MG/0.5ML Soaj Inject 1 mg into the skin once  a week for 28 days. Start taking on: March 30, 2023 Started by: Sharlene Dory   Semaglutide-Weight Management 1.7 MG/0.75ML Soaj Inject 1.7 mg into the skin once a week for 28 days. Start taking on: April 28, 2023 Started by: Sharlene Dory   Semaglutide-Weight Management 2.4 MG/0.75ML Soaj Inject 2.4 mg into the skin once a week for 28 days. Start taking on: May 27, 2023 Started by: Sharlene Dory        Exam BP 124/82 (BP Location: Left Arm, Patient Position: Sitting, Cuff Size: Normal)   Pulse 83   Temp 98 F (36.7 C) (Oral)   Resp 16   Ht 5\' 5"  (1.651 m)   Wt 212 lb (96.2 kg)   LMP 10/16/2019   SpO2 100%   BMI 35.28 kg/m  General:  well developed, well nourished, in no apparent distress Lungs:  No respiratory distress Psych: well oriented with normal range of affect and age-appropriate judgement/insight, alert and oriented x4.  Assessment and Plan  Class 1 obesity due to excess calories without serious comorbidity with body mass index (BMI) of 33.0 to 33.9 in adult - Plan: Semaglutide-Weight Management 0.25 MG/0.5ML SOAJ, Semaglutide-Weight Management 0.5 MG/0.5ML SOAJ, Semaglutide-Weight Management 1 MG/0.5ML SOAJ, Semaglutide-Weight Management 1.7 MG/0.75ML SOAJ, Semaglutide-Weight Management 2.4 MG/0.75ML SOAJ  GAD (generalized anxiety disorder)  Need for influenza vaccination - Plan: Flu vaccine  trivalent PF, 6mos and older(Flulaval,Afluria,Fluarix,Fluzone)  Chronic, unstable. Start [place], get to highest dosage. Counseled on diet/exercise.  Chronic, unstable. Restart Celexa at 10 mg/d. Cont hydroxyzine prn.  Flu shot today.  F/u in 2 mo. The patient voiced understanding and agreement to the plan.  Jilda Roche [place], DO 01/31/23 9:57 AM

## 2023-01-31 NOTE — Telephone Encounter (Signed)
PA initiated via Covermymeds; KEY: [id number]. Awaiting determination.

## 2023-01-31 NOTE — Telephone Encounter (Signed)
PA approved.   WEGOVY 0.25 MG/0.5 ML PEN: quantity/billable units of 2 approved for 01/31/2023- 07/30/2023.

## 2023-02-17 NOTE — Telephone Encounter (Signed)
He will want to see you for medication changes, call to schedule appt please.

## 2023-02-28 ENCOUNTER — Other Ambulatory Visit (HOSPITAL_BASED_OUTPATIENT_CLINIC_OR_DEPARTMENT_OTHER): Payer: Self-pay

## 2023-04-01 ENCOUNTER — Other Ambulatory Visit (HOSPITAL_BASED_OUTPATIENT_CLINIC_OR_DEPARTMENT_OTHER): Payer: Self-pay

## 2023-04-02 ENCOUNTER — Ambulatory Visit: Payer: Medicaid Other | Admitting: Family Medicine

## 2023-04-02 ENCOUNTER — Other Ambulatory Visit (HOSPITAL_BASED_OUTPATIENT_CLINIC_OR_DEPARTMENT_OTHER): Payer: Self-pay

## 2023-04-02 ENCOUNTER — Encounter: Payer: Self-pay | Admitting: Family Medicine

## 2023-04-02 VITALS — BP 116/76 | HR 72 | Temp 98.0°F | Resp 16 | Ht 65.0 in | Wt 200.0 lb

## 2023-04-02 DIAGNOSIS — E6609 Other obesity due to excess calories: Secondary | ICD-10-CM

## 2023-04-02 DIAGNOSIS — E66811 Obesity, class 1: Secondary | ICD-10-CM

## 2023-04-02 DIAGNOSIS — K219 Gastro-esophageal reflux disease without esophagitis: Secondary | ICD-10-CM | POA: Diagnosis not present

## 2023-04-02 DIAGNOSIS — Z6833 Body mass index (BMI) 33.0-33.9, adult: Secondary | ICD-10-CM | POA: Diagnosis not present

## 2023-04-02 MED ORDER — OMEPRAZOLE 40 MG PO CPDR
DELAYED_RELEASE_CAPSULE | ORAL | 3 refills | Status: AC
Start: 2023-04-02 — End: ?
  Filled 2023-04-02: qty 30, 30d supply, fill #0
  Filled 2023-04-27: qty 30, 30d supply, fill #1

## 2023-04-02 NOTE — Patient Instructions (Addendum)
Strong work with your weight loss.   Pepcid/famotidine 20 mg 1-2 times daily can help with reflux symptoms.   Consider [facility]: Drs. [place], [place], [place] or [employer].  Let us know if you need anything.

## 2023-04-02 NOTE — Progress Notes (Signed)
Chief Complaint  Patient presents with   Follow-up    Follow up    Subjective: Patient is a 33 y.o. female here for f/u.  Lost around 12 lbs. Currently on Wegovy 0.5 mg/week. Moving up to 1 mg/week. Having some reflux for AE's. Has been doing calisthenics. Diet better. No pregnancy plans, had a hysterectomy.   Past Medical History:  Diagnosis Date   Abnormal Pap smear of cervix 2011   Dysmenorrhea    Leiomyoma of uterus    Menometrorrhagia    Seasonal allergies     Objective: BP 116/76   Pulse 72   Temp 98 F (36.7 C) (Oral)   Resp 16   Ht 5\' 5"  (1.651 m)   Wt 200 lb (90.7 kg)   LMP 10/16/2019   SpO2 100%   BMI 33.28 kg/m  General: Awake, appears stated age Heart: RRR, no LE edema Lungs: CTAB, no rales, wheezes or rhonchi. No accessory muscle use Psych: Age appropriate judgment and insight, normal affect and mood  Assessment and Plan: Class 1 obesity due to excess calories without serious comorbidity with body mass index (BMI) of 33.0 to 33.9 in adult  Gastroesophageal reflux disease, unspecified whether esophagitis present - Plan: omeprazole (PRILOSEC) 40 MG capsule  Chronic, stable.  Continue titration increase of Wegovy, starting 1 mg weekly.  Counseled on diet and exercise.  She has had a hysterectomy. Exacerbation of chronic issue.  Continue famotidine 20 mg as needed.  Omeprazole 40 mg daily as needed sent as well. Follow-up in 6 months for physical or as needed. The patient voiced understanding and agreement to the plan.  Jilda Roche [place], DO 04/02/23  11:56 AM

## 2023-04-28 ENCOUNTER — Ambulatory Visit: Payer: Medicaid Other | Admitting: Family Medicine

## 2023-04-28 ENCOUNTER — Encounter: Payer: Self-pay | Admitting: Family Medicine

## 2023-04-28 ENCOUNTER — Other Ambulatory Visit (HOSPITAL_BASED_OUTPATIENT_CLINIC_OR_DEPARTMENT_OTHER): Payer: Self-pay

## 2023-04-28 VITALS — BP 110/70 | HR 76 | Temp 99.4°F | Resp 16 | Ht 65.0 in | Wt 195.2 lb

## 2023-04-28 DIAGNOSIS — J00 Acute nasopharyngitis [common cold]: Secondary | ICD-10-CM

## 2023-04-28 DIAGNOSIS — K5903 Drug induced constipation: Secondary | ICD-10-CM | POA: Diagnosis not present

## 2023-04-28 MED ORDER — FLUTICASONE PROPIONATE 50 MCG/ACT NA SUSP
2.0000 | Freq: Every day | NASAL | 6 refills | Status: AC
  Filled 2023-04-28: qty 16, 30d supply, fill #0

## 2023-04-28 NOTE — Patient Instructions (Addendum)
 Stay hydrated.  Take Metamucil or Benefiber daily.  Try 2 tablespoons of milk of mag in 4 oz of warm prune juice. Do that and wait a couple hours. If no improvement, try a Dulcolax suppository and then let me know if we are still having issues.   Go back on Flonase. Let me know if things don't improve.   Let us know if you need anything.

## 2023-04-28 NOTE — Progress Notes (Signed)
 Chief Complaint  Patient presents with   Constipation    Constipation    Subjective: Patient is a 33 y.o. female here for f/u.  Pt has BM's every 3-4 days. Has incomplete emptying. Has tried Colace and MiraLAX without relief. She has intermittent abd pain from it. No vomiting, some nausea. She is still passing gas. No bleeding. She is straining, stool is hard balls. This has been going on for several months since going back on the [facility], slightly getting worse.   URI Duration: 4 days  Associated symptoms: sore throat, myalgia, and coughing, hoarsness Denies: sinus headache, sinus congestion, sinus pain, rhinorrhea, itchy watery eyes, ear pain, ear drainage, wheezing, shortness of breath, and fevers Treatment to date: Tylenol, cough drops Sick contacts: No  Past Medical History:  Diagnosis Date   Abnormal Pap smear of cervix 2011   Dysmenorrhea    Leiomyoma of uterus    Menometrorrhagia    Seasonal allergies     Objective: BP 110/70 (BP Location: Left Arm, Patient Position: Sitting)   Pulse 76   Temp 99.4 F (37.4 C) (Oral)   Resp 16   Ht 5\' 5"  (1.651 m)   Wt 195 lb 3.2 oz (88.5 kg)   LMP 10/16/2019   SpO2 98%   BMI 32.48 kg/m  General: Awake, appears stated age Heart: RRR, no LE edema HEENT: Ears are patent without otorrhea, TM's neg, no sinus ttp, nares patent w/o rhinorrhea, MMM, no pharyngeal exudate/erythema Lungs: CTAB, no rales, wheezes or rhonchi. No accessory muscle use Abdomen: Bowel sounds present, soft, nontender, nondistended Psych: Age appropriate judgment and insight, normal affect and mood  Assessment and Plan: Drug-induced constipation  Acute rhinitis - Plan: fluticasone (FLONASE) 50 MCG/ACT nasal spray  Stay hydrated.  Daily Metamucil/Benefiber recommended.  If need have a more immediate bowel movement, she is recommended to take 2 tablespoons of milk of magnesia mixed with 4 ounces of warm prune juice.  If this does not help, would consider a  suppository versus enema. Exam is reassuring today.  Voice seems to be returning.  Add back Flonase to help with drainage. Follow-up as originally scheduled. The patient voiced understanding and agreement to the plan.  Jilda Roche [place], DO 04/28/23  4:06 PM

## 2023-05-14 ENCOUNTER — Encounter: Payer: Self-pay | Admitting: Obstetrics & Gynecology

## 2023-05-14 ENCOUNTER — Ambulatory Visit: Payer: Medicaid Other | Admitting: Obstetrics & Gynecology

## 2023-05-14 VITALS — BP 105/76 | HR 78 | Ht 65.0 in | Wt 186.0 lb

## 2023-05-14 DIAGNOSIS — Z01419 Encounter for gynecological examination (general) (routine) without abnormal findings: Secondary | ICD-10-CM

## 2023-05-14 NOTE — Progress Notes (Signed)
 Subjective:     Penny Klein is a 33 y.o. female here for a routine exam.  Current complaints: no GYN complaints. S/p hyst in 202 due to AUB not relieved with conservative measures. She denies problems since that time.  She is currently on Wegovy.      Gynecologic History Patient's last menstrual period was 10/16/2019. Contraception: status post hysterectomy Last Pap: 04/02/2018. Results were: normal Last mammogram: n/a.  Obstetric History OB History  Gravida Para Term Preterm AB Living  3 2 2   2   SAB IAB Ectopic Multiple Live Births      2    # Outcome Date GA Lbr Len/2nd Weight Sex Type Anes PTL Lv  3 Gravida           2 Term 07/30/12 [redacted]w[redacted]d  7 lb (3.175 kg) F Vag-Spont   LIV  1 Term 03/14/09 [redacted]w[redacted]d  7 lb 11 oz (3.487 kg) M Vag-Spont   LIV     The following portions of the patient's history were reviewed and updated as appropriate: allergies, current medications, past family history, past medical history, past social history, past surgical history, and problem list.  Review of Systems Pertinent items are noted in HPI.    Objective:  BP 105/76   Pulse 78   Ht 5\' 5"  (1.651 m)   Wt 186 lb (84.4 kg)   LMP 10/16/2019   BMI 30.95 kg/m   General Appearance:    Alert, cooperative, no distress, appears stated age  Head:    Normocephalic, without obvious abnormality, atraumatic  Eyes:    conjunctiva/corneas clear, EOM's intact, both eyes  Ears:    Normal external ear canals, both ears  Nose:   Nares normal, septum midline, mucosa normal, no drainage    or sinus tenderness  Throat:   Lips, mucosa, and tongue normal; teeth and gums normal  Neck:   Supple, symmetrical, trachea midline, no adenopathy;    thyroid:  no enlargement/tenderness/nodules  Back:     Symmetric, no curvature, ROM normal, no CVA tenderness  Lungs:     respirations unlabored  Chest Wall:    No tenderness or deformity   Heart:    Regular rate and rhythm  Breast Exam:    No tenderness, masses, or nipple  abnormality  Abdomen:     Soft, non-tender, bowel sounds active all four quadrants,    no masses, no organomegaly  Genitalia:    Normal female without lesion, discharge or tenderness     Extremities:   Extremities normal, atraumatic, no cyanosis or edema  Pulses:   2+ and symmetric all extremities  Skin:   Skin color, texture, turgor normal, no rashes or lesions     Assessment:    Healthy female exam.    Plan:  No annual PAP or breast screening indicated at this time.  F/u in 1 year or sooner prn  Discussed safe weight loss.   Penny Klein, M.D., [place]

## 2023-06-02 ENCOUNTER — Other Ambulatory Visit (HOSPITAL_BASED_OUTPATIENT_CLINIC_OR_DEPARTMENT_OTHER): Payer: Self-pay

## 2023-09-30 ENCOUNTER — Encounter: Payer: Medicaid Other | Admitting: Family Medicine
